# Patient Record
Sex: Female | Born: 2000 | Race: White | Hispanic: No | Marital: Married | Smoking: Former smoker
Health system: Southern US, Community
[De-identification: ages and names within clinical notes are randomized; demographics above are authoritative.]

## PROBLEM LIST (undated history)

## (undated) ENCOUNTER — Inpatient Hospital Stay: Payer: Self-pay

## (undated) DIAGNOSIS — F419 Anxiety disorder, unspecified: Secondary | ICD-10-CM

## (undated) DIAGNOSIS — R51 Headache: Secondary | ICD-10-CM

## (undated) DIAGNOSIS — G43909 Migraine, unspecified, not intractable, without status migrainosus: Secondary | ICD-10-CM

## (undated) HISTORY — DX: Headache: R51

---
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
## 2021-01-28 ENCOUNTER — Telehealth: Payer: Self-pay | Admitting: Obstetrics and Gynecology

## 2021-01-28 NOTE — Telephone Encounter (Signed)
.. °  Medicaid Managed Care   Unsuccessful Outreach Note  01/28/2021 Name: Alyssa Rojas MRN: [id number] DOB: Jun 17, 2000  Referred by: Suzy Bouchard, MD Reason for referral : High Risk Managed Medicaid (I called the patient today to get her scheduled with the MM Team. I left my name and number on her VM.)   An unsuccessful telephone outreach was attempted today. The patient was referred to the case management team for assistance with care management and care coordination.   Follow Up Plan: The care management team will reach out to the patient again over the next 14 days.   Weston Settle Care Guide, High Risk Medicaid Managed Care Embedded Care Coordination [facility]   [facility]

## 2021-02-04 ENCOUNTER — Other Ambulatory Visit: Payer: Self-pay | Admitting: Obstetrics and Gynecology

## 2021-02-04 NOTE — Patient Instructions (Signed)
Hi Ms. Gorelik, sorry we missed you today- as a part of your Medicaid benefit, you are eligible for care management and care coordination services at no cost or copay. I was unable to reach you by phone today but would be happy to help you with your health related needs. Please feel free to call me at [phone].  A member of the Managed Medicaid care management team will reach out to you again over the next 7 days.   Aida Raider RN, BSN [place]   [facility] - Managed Medicaid High Risk [phone]

## 2021-02-04 NOTE — Patient Outreach (Signed)
Care Coordination  02/04/2021  [name] 23-Mar-2000 [id number]   Medicaid Managed Care   Unsuccessful Outreach Note  02/04/2021 Name: Alyssa Rojas MRN: [id number] DOB: Apr 16, 2000  Referred by: Suzy Bouchard, MD Reason for referral : High Risk Managed Medicaid (Unsuccessful telephone outreach)   An unsuccessful telephone outreach was attempted today. The patient was referred to the case management team for assistance with care management and care coordination.   Follow Up Plan: The care management team will reach out to the patient again over the next 7-14 days.   Kathi Der RN, BSN [place]   [facility], [employer] - Managed Medicaid High Risk [phone]

## 2021-02-11 ENCOUNTER — Telehealth: Payer: Self-pay | Admitting: Obstetrics and Gynecology

## 2021-02-11 NOTE — Telephone Encounter (Signed)
.. °  Medicaid Managed Care   Unsuccessful Outreach Note  02/11/2021 Name: Alyssa Rojas MRN: [id number] DOB: 2000/07/25  Referred by: Boykin Nearing, MD Reason for referral : High Risk Managed Medicaid (I called the patient today to get her rescheduled for a phone visit with the MM RNCM. Her VM was full.)   An unsuccessful telephone outreach was attempted today. The patient was referred to the case management team for assistance with care management and care coordination.   Follow Up Plan: The care management team will reach out to the patient again over the next 7 days.    [place]

## 2021-02-15 ENCOUNTER — Telehealth: Payer: Self-pay | Admitting: Obstetrics and Gynecology

## 2021-02-15 DIAGNOSIS — F418 Other specified anxiety disorders: Secondary | ICD-10-CM | POA: Diagnosis not present

## 2021-02-15 DIAGNOSIS — O99345 Other mental disorders complicating the puerperium: Secondary | ICD-10-CM | POA: Diagnosis not present

## 2021-02-15 DIAGNOSIS — Z1332 Encounter for screening for maternal depression: Secondary | ICD-10-CM | POA: Diagnosis not present

## 2021-02-15 DIAGNOSIS — Z30011 Encounter for initial prescription of contraceptive pills: Secondary | ICD-10-CM | POA: Diagnosis not present

## 2021-02-15 NOTE — Telephone Encounter (Signed)
.. °  Medicaid Managed Care   Unsuccessful Outreach Note  02/15/2021 Name: Alyssa Rojas MRN: [id number] DOB: [birth year]  Referred by: Suzy Bouchard, MD Reason for referral : No chief complaint on file.   Third unsuccessful telephone outreach was attempted today. The patient was referred to the case management team for assistance with care management and care coordination. The patient's primary care provider has been notified of our unsuccessful attempts to make or maintain contact with the patient. The care management team is pleased to engage with this patient at any time in the future should he/she be interested in assistance from the care management team.   Follow Up Plan: We have been unable to make contact with the patient for follow up. The care management team is available to follow up with the patient after provider conversation with the patient regarding recommendation for care management engagement and subsequent re-referral to the care management team.   Weston Settle Care Guide, High Risk Medicaid Managed Care Embedded Care Coordination [facility]   [facility]

## 2021-03-01 DIAGNOSIS — Z1332 Encounter for screening for maternal depression: Secondary | ICD-10-CM | POA: Diagnosis not present

## 2021-03-01 DIAGNOSIS — O99345 Other mental disorders complicating the puerperium: Secondary | ICD-10-CM | POA: Diagnosis not present

## 2021-09-12 ENCOUNTER — Other Ambulatory Visit: Payer: Self-pay

## 2021-09-12 ENCOUNTER — Encounter: Payer: Self-pay | Admitting: Emergency Medicine

## 2021-09-12 ENCOUNTER — Emergency Department
Admission: EM | Admit: 2021-09-12 | Discharge: 2021-09-12 | Disposition: A | Discharge status: Home / Self Care | Payer: Medicaid Other | Attending: Emergency Medicine | Admitting: Emergency Medicine

## 2021-09-12 ENCOUNTER — Emergency Department: Payer: Medicaid Other

## 2021-09-12 DIAGNOSIS — Z20822 Contact with and (suspected) exposure to covid-19: Secondary | ICD-10-CM | POA: Diagnosis not present

## 2021-09-12 DIAGNOSIS — R110 Nausea: Secondary | ICD-10-CM

## 2021-09-12 DIAGNOSIS — R197 Diarrhea, unspecified: Secondary | ICD-10-CM | POA: Insufficient documentation

## 2021-09-12 DIAGNOSIS — R1110 Vomiting, unspecified: Secondary | ICD-10-CM

## 2021-09-12 DIAGNOSIS — R1011 Right upper quadrant pain: Secondary | ICD-10-CM | POA: Diagnosis not present

## 2021-09-12 DIAGNOSIS — N390 Urinary tract infection, site not specified: Secondary | ICD-10-CM

## 2021-09-12 DIAGNOSIS — R945 Abnormal results of liver function studies: Secondary | ICD-10-CM | POA: Insufficient documentation

## 2021-09-12 DIAGNOSIS — R112 Nausea with vomiting, unspecified: Secondary | ICD-10-CM | POA: Insufficient documentation

## 2021-09-12 LAB — COMPREHENSIVE METABOLIC PANEL
ALT: 77 U/L — ABNORMAL HIGH (ref 0–44)
AST: 44 U/L — ABNORMAL HIGH (ref 15–41)
Albumin: 4.7 g/dL (ref 3.5–5.0)
Alkaline Phosphatase: 46 U/L (ref 38–126)
Anion gap: 9 (ref 5–15)
BUN: 10 mg/dL (ref 6–20)
CO2: 22 mmol/L (ref 22–32)
Calcium: 9.8 mg/dL (ref 8.9–10.3)
Chloride: 110 mmol/L (ref 98–111)
Creatinine, Ser: 0.91 mg/dL (ref 0.44–1.00)
GFR, Estimated: >60 mL/min (ref >60)
Glucose, Bld: 165 mg/dL — ABNORMAL HIGH (ref 70–99)
Potassium: 3.4 mmol/L — ABNORMAL LOW (ref 3.5–5.1)
Sodium: 141 mmol/L (ref 135–145)
Total Bilirubin: 0.7 mg/dL (ref 0.3–1.2)
Total Protein: 7.4 g/dL (ref 6.5–8.1)

## 2021-09-12 LAB — CK: Total CK: 75 U/L (ref 38–234)

## 2021-09-12 LAB — URINALYSIS, ROUTINE W REFLEX MICROSCOPIC
Bilirubin Urine: NEGATIVE
Glucose, UA: NEGATIVE mg/dL
Ketones, ur: 80 mg/dL — AB
Leukocytes,Ua: NEGATIVE
Nitrite: NEGATIVE
Protein, ur: 30 mg/dL — AB
Specific Gravity, Urine: 1.024 (ref 1.005–1.030)
pH: 5 (ref 5.0–8.0)

## 2021-09-12 LAB — CBC
HCT: 38.8 % (ref 36.0–46.0)
Hemoglobin: 13 g/dL (ref 12.0–15.0)
MCH: 28.6 pg (ref 26.0–34.0)
MCHC: 33.5 g/dL (ref 30.0–36.0)
MCV: 85.5 fL (ref 80.0–100.0)
Platelets: 298 10*3/uL (ref 150–400)
RBC: 4.54 MIL/uL (ref 3.87–5.11)
RDW: 12.4 % (ref 11.5–15.5)
WBC: 10.3 10*3/uL (ref 4.0–10.5)
nRBC: 0 % (ref 0.0–0.2)

## 2021-09-12 LAB — RESP PANEL BY RT-PCR (FLU A&B, COVID) ARPGX2
Influenza A by PCR: NEGATIVE
Influenza B by PCR: NEGATIVE
SARS Coronavirus 2 by RT PCR: NEGATIVE

## 2021-09-12 LAB — HCG, QUANTITATIVE, PREGNANCY: hCG, Beta Chain, Quant, S: 1 m[IU]/mL (ref <5)

## 2021-09-12 LAB — LIPASE, BLOOD: Lipase: 24 U/L (ref 11–51)

## 2021-09-12 LAB — POC URINE PREG, ED: Preg Test, Ur: NEGATIVE

## 2021-09-12 MED ORDER — ONDANSETRON HCL 4 MG/2ML IJ SOLN
INTRAMUSCULAR | Status: AC
  Administered 2021-09-12: 4 mg
  Filled 2021-09-12: qty 2

## 2021-09-12 MED ORDER — DROPERIDOL 2.5 MG/ML IJ SOLN: 2.5000 mg | Freq: Once | INTRAMUSCULAR | Status: DC

## 2021-09-12 MED ORDER — LACTATED RINGERS IV BOLUS: 1000.0000 mL | Freq: Once | INTRAVENOUS | Status: DC

## 2021-09-12 MED ORDER — CEPHALEXIN 500 MG PO CAPS: 500.0000 mg | ORAL_CAPSULE | Freq: Three times a day (TID) | ORAL | 0 refills | Status: AC

## 2021-09-12 MED ORDER — SODIUM CHLORIDE 0.9 % IV BOLUS
1000.0000 mL | Freq: Once | INTRAVENOUS | Status: AC
Start: 2021-09-12 — End: 2021-09-12
  Administered 2021-09-12: 1000 mL via INTRAVENOUS

## 2021-09-12 MED ORDER — ONDANSETRON HCL 4 MG/2ML IJ SOLN: 4.0000 mg | Freq: Once | INTRAMUSCULAR | Status: DC

## 2021-09-12 MED ORDER — DROPERIDOL 2.5 MG/ML IJ SOLN: 1.2500 mg | Freq: Once | INTRAMUSCULAR | Status: DC | PRN

## 2021-09-12 MED ORDER — ONDANSETRON 4 MG PO TBDP: 4.0000 mg | ORAL_TABLET | Freq: Three times a day (TID) | ORAL | 0 refills | Status: DC | PRN

## 2021-09-12 MED ORDER — METOCLOPRAMIDE HCL 5 MG/ML IJ SOLN
10.0000 mg | Freq: Once | INTRAMUSCULAR | Status: AC
  Administered 2021-09-12: 10 mg via INTRAVENOUS
  Filled 2021-09-12: qty 2

## 2021-09-12 MED ORDER — ONDANSETRON HCL 4 MG/2ML IJ SOLN
4.0000 mg | Freq: Once | INTRAMUSCULAR | Status: AC
  Administered 2021-09-12: 4 mg via INTRAVENOUS
  Filled 2021-09-12: qty 2

## 2021-09-12 NOTE — Discharge Instructions (Addendum)
I will give you some Zofran melt on your tongue wafers to use for nausea.  1 way for 3 times a day.  I will give you some Keflex antibiotic 1 pill 3 times a day also to treat the urinary tract infection that showed up in the urine.  Please do not hesitate to return if you start feeling worse, run a fever or cannot keep anything down again.

## 2021-09-12 NOTE — ED Provider Notes (Signed)
[facility] Provider Note    Event Date/Time   First MD Initiated Contact with Patient 09/12/21 [phone]     (approximate)   History   Nausea and Vomiting   HPI  Alyssa Rojas is a 21 y.o. female 9 months postpartum who comes in with concerns for nausea and vomiting.  Patient reports 3 days and not been able to eat with upper abdominal discomfort nausea, vomiting.  She does report having 1 episode of diarrhea this morning.  She denies any headaches falls hitting her head, chest pain or shortness of breath.  She denies any lower abdominal tenderness.  She denies any prior known history of issues with her gallbladder.  She did have a lot of hyperemesis gravidarum in the setting of being pregnant but denies currently being pregnant.  She did report feeling warm today as well but then never took a temperature.  They report patient having a history of CBD use but they deny any marijuana use.  Physical Exam   Triage Vital Signs: ED Triage Vitals  Enc Vitals Group     BP 09/12/21 0608 (!) 145/90     Pulse Rate 09/12/21 0608 84     Resp 09/12/21 0608 (!) 22     Temp 09/12/21 0608 97.8 F (36.6 C)     Temp Source 09/12/21 0608 Oral     SpO2 09/12/21 0608 99 %     Weight 09/12/21 0604 103 lb (46.7 kg)     Height 09/12/21 0604 5\' 1"  (1.549 m)     Head Circumference --      Peak Flow --      Pain Score 09/12/21 0604 0     Pain Loc --      Pain Edu? --      Excl. in GC? --     Most recent vital signs: Vitals:   09/12/21 0608  BP: (!) 145/90  Pulse: 84  Resp: (!) 22  Temp: 97.8 F (36.6 C)  SpO2: 99%     General: Awake, no distress.  CV:  Good peripheral perfusion.  Resp:  Normal effort.  Abd:  No distention.  Slight tenderness in the upper abdomen. Other:  Patient is actively vomiting yellowish   ED Results / Procedures / Treatments   Labs (all labs ordered are listed, but only abnormal results are displayed) Labs Reviewed  COMPREHENSIVE  METABOLIC PANEL - Abnormal; Notable for the following components:      Result Value   Potassium 3.4 (*)    Glucose, Bld 165 (*)    AST 44 (*)    ALT 77 (*)    All other components within normal limits  RESP PANEL BY RT-PCR (FLU A&B, COVID) ARPGX2  LIPASE, BLOOD  CBC  URINALYSIS, ROUTINE W REFLEX MICROSCOPIC  HCG, QUANTITATIVE, PREGNANCY  CK  POC URINE PREG, ED      RADIOLOGY Pending   PROCEDURES:  Critical Care performed: No  Procedures   MEDICATIONS ORDERED IN ED: Medications  ondansetron (ZOFRAN) injection 4 mg (has no administration in time range)  droperidol (INAPSINE) 2.5 MG/ML injection 2.5 mg (has no administration in time range)  sodium chloride 0.9 % bolus 1,000 mL (has no administration in time range)  ondansetron (ZOFRAN) 4 MG/2ML injection (4 mg  Given 09/12/21 09/14/21)     IMPRESSION / MDM / ASSESSMENT AND PLAN / ED COURSE  I reviewed the triage vital signs and the nursing notes.   Patient's presentation is most consistent  with acute presentation with potential threat to life or bodily function.   Differential includes COVID, flu, gallbladder issues other viral illness.  Patient was given Zofran but still actively vomiting therefore I will give a dose of droperidol and some fluids.  CBC shows normal white count hemoglobin stable.  Lipase is normal.  Her LFTs are slightly elevated but she has had that previously could just be from all the vomiting but given a little bit of upper abdominal discomfort I ordered an ultrasound to rule out gallstones.      FINAL CLINICAL IMPRESSION(S) / ED DIAGNOSES   Final diagnoses:  Vomiting, unspecified vomiting type, unspecified whether nausea present     Rx / DC Orders   ED Discharge Orders     None        Note:  This document was prepared using Dragon voice recognition software and may include unintentional dictation errors.   Concha Se, MD 09/12/21 [phone]

## 2021-09-12 NOTE — ED Notes (Signed)
Patient given ginger ale for PO trial

## 2021-09-12 NOTE — ED Notes (Signed)
Pt ambulatory to waiting room. Pt verbalized understanding of discharge instructions.   

## 2021-09-12 NOTE — ED Triage Notes (Signed)
Pt to ED from home c/o n/v x3 days intermittently, diarrhea this morning, decreased appetite as well.  Husband states something similar when she was pregnant, but states had her period 2 days ago.  Denies fevers or urinary changes, denies pain.  Pt uncontrollably retching.

## 2021-09-12 NOTE — ED Notes (Signed)
Patient aware of needing a urine sample at this time. States she cannot provide at this time. IV fluids started.

## 2021-09-27 ENCOUNTER — Encounter: Payer: Self-pay | Admitting: Intensive Care

## 2021-09-27 ENCOUNTER — Emergency Department
Admission: EM | Admit: 2021-09-27 | Discharge: 2021-09-27 | Disposition: A | Discharge status: Home / Self Care | Payer: Medicaid Other | Attending: Emergency Medicine | Admitting: Emergency Medicine

## 2021-09-27 ENCOUNTER — Other Ambulatory Visit: Payer: Self-pay

## 2021-09-27 DIAGNOSIS — R1115 Cyclical vomiting syndrome unrelated to migraine: Secondary | ICD-10-CM | POA: Insufficient documentation

## 2021-09-27 DIAGNOSIS — R112 Nausea with vomiting, unspecified: Secondary | ICD-10-CM | POA: Diagnosis present

## 2021-09-27 HISTORY — DX: Anxiety disorder, unspecified: F41.9

## 2021-09-27 LAB — CBC
HCT: 41.5 % (ref 36.0–46.0)
Hemoglobin: 13.7 g/dL (ref 12.0–15.0)
MCH: 28.2 pg (ref 26.0–34.0)
MCHC: 33 g/dL (ref 30.0–36.0)
MCV: 85.6 fL (ref 80.0–100.0)
Platelets: 237 10*3/uL (ref 150–400)
RBC: 4.85 MIL/uL (ref 3.87–5.11)
RDW: 12.6 % (ref 11.5–15.5)
WBC: 11.1 10*3/uL — ABNORMAL HIGH (ref 4.0–10.5)
nRBC: 0 % (ref 0.0–0.2)

## 2021-09-27 LAB — URINALYSIS, ROUTINE W REFLEX MICROSCOPIC
Bacteria, UA: NONE SEEN
Bilirubin Urine: NEGATIVE
Glucose, UA: NEGATIVE mg/dL
Hgb urine dipstick: NEGATIVE
Ketones, ur: 20 mg/dL — AB
Leukocytes,Ua: NEGATIVE
Nitrite: NEGATIVE
Protein, ur: 30 mg/dL — AB
Specific Gravity, Urine: 1.025 (ref 1.005–1.030)
pH: 5 (ref 5.0–8.0)

## 2021-09-27 LAB — COMPREHENSIVE METABOLIC PANEL
ALT: 14 U/L (ref 0–44)
AST: 18 U/L (ref 15–41)
Albumin: 5.1 g/dL — ABNORMAL HIGH (ref 3.5–5.0)
Alkaline Phosphatase: 40 U/L (ref 38–126)
Anion gap: 11 (ref 5–15)
BUN: 10 mg/dL (ref 6–20)
CO2: 19 mmol/L — ABNORMAL LOW (ref 22–32)
Calcium: 9.7 mg/dL (ref 8.9–10.3)
Chloride: 108 mmol/L (ref 98–111)
Creatinine, Ser: 0.8 mg/dL (ref 0.44–1.00)
GFR, Estimated: >60 mL/min (ref >60)
Glucose, Bld: 117 mg/dL — ABNORMAL HIGH (ref 70–99)
Potassium: 3.7 mmol/L (ref 3.5–5.1)
Sodium: 138 mmol/L (ref 135–145)
Total Bilirubin: 1.2 mg/dL (ref 0.3–1.2)
Total Protein: 7.8 g/dL (ref 6.5–8.1)

## 2021-09-27 LAB — TSH: TSH: 0.435 u[IU]/mL (ref 0.350–4.500)

## 2021-09-27 LAB — LIPASE, BLOOD: Lipase: 24 U/L (ref 11–51)

## 2021-09-27 LAB — POC URINE PREG, ED: Preg Test, Ur: NEGATIVE

## 2021-09-27 MED ORDER — PROMETHAZINE HCL 12.5 MG PO TABS
12.5000 mg | ORAL_TABLET | Freq: Four times a day (QID) | ORAL | 0 refills | Status: DC | PRN
Start: 2021-09-27 — End: 2023-08-22

## 2021-09-27 MED ORDER — OMEPRAZOLE MAGNESIUM 20 MG PO TBEC: 20.0000 mg | DELAYED_RELEASE_TABLET | Freq: Every day | ORAL | 1 refills | Status: DC

## 2021-09-27 NOTE — ED Provider Notes (Signed)
[facility] Provider Note    Event Date/Time   First MD Initiated Contact with Patient 09/27/21 1608     (approximate)   History   Chief Complaint Emesis and Anxiety   HPI  Alyssa Rojas is a 21 y.o. female with past medical history of migraines and anxiety who presents to the ED complaining of nausea and vomiting.  Patient reports that she has been dealing with daily episodes of nausea and vomiting for at least the past 2 weeks.  She states that she typically feels nauseous when she gets up in the morning, has been able to drink liquids but whenever she tries to eat eat solids, she feels very full and ends up vomiting.  She can eat a few small bites at a time without vomiting, but more significant intake always results and epigastric pain with vomiting.  She reports weight loss of about 6 pounds over the past month, has been taking Zofran with partial relief.  She reports significant issues with nausea and vomiting during her pregnancy about 1 year ago, also had significant issues with her stomach when she was a child, previously followed at [facility] pediatric gastroenterology.  She does not currently take any medication for her stomach other than Zofran.  She was treated for a UTI a few weeks ago but currently denies any urinary symptoms.  She is additionally concerned about a small lump to the left side of her neck that is nontender.     Physical Exam   Triage Vital Signs: ED Triage Vitals  Enc Vitals Group     BP 09/27/21 1509 (!) 127/92     Pulse Rate 09/27/21 1509 78     Resp 09/27/21 1509 18     Temp 09/27/21 1509 97.8 F (36.6 C)     Temp Source 09/27/21 1509 Oral     SpO2 09/27/21 1509 93 %     Weight 09/27/21 1500 93 lb 1.6 oz (42.2 kg)     Height 09/27/21 1500 5\' 1"  (1.549 m)     Head Circumference --      Peak Flow --      Pain Score 09/27/21 1505 0     Pain Loc --      Pain Edu? --      Excl. in GC? --     Most recent vital  signs: Vitals:   09/27/21 1509  BP: (!) 127/92  Pulse: 78  Resp: 18  Temp: 97.8 F (36.6 C)  SpO2: 93%    Constitutional: Alert and oriented. Eyes: Conjunctivae are normal. Head: Atraumatic. Nose: No congestion/rhinnorhea. Mouth/Throat: Mucous membranes are moist.  Cardiovascular: Normal rate, regular rhythm. Grossly normal heart sounds.  2+ radial pulses bilaterally. Respiratory: Normal respiratory effort.  No retractions. Lungs CTAB. Gastrointestinal: Soft and nontender. No distention. Musculoskeletal: No lower extremity tenderness nor edema.  Neurologic:  Normal speech and language. No gross focal neurologic deficits are appreciated.    ED Results / Procedures / Treatments   Labs (all labs ordered are listed, but only abnormal results are displayed) Labs Reviewed  COMPREHENSIVE METABOLIC PANEL - Abnormal; Notable for the following components:      Result Value   CO2 19 (*)    Glucose, Bld 117 (*)    Albumin 5.1 (*)    All other components within normal limits  CBC - Abnormal; Notable for the following components:   WBC 11.1 (*)    All other components within normal limits  URINALYSIS,  ROUTINE W REFLEX MICROSCOPIC - Abnormal; Notable for the following components:   Color, Urine YELLOW (*)    APPearance HAZY (*)    Ketones, ur 20 (*)    Protein, ur 30 (*)    All other components within normal limits  LIPASE, BLOOD  TSH  POC URINE PREG, ED    PROCEDURES:  Critical Care performed: No  Procedures   MEDICATIONS ORDERED IN ED: Medications - No data to display   IMPRESSION / MDM / ASSESSMENT AND PLAN / ED COURSE  I reviewed the triage vital signs and the nursing notes.                              21 y.o. female with past medical history of migraines and anxiety who presents to the ED with recurrent episodes of nausea and vomiting most days along with significant difficulty eating solids.  Patient's presentation is most consistent with acute presentation  with potential threat to life or bodily function.  Differential diagnosis includes, but is not limited to, biliary colic, cholecystitis, dehydration, electrolyte abnormality, AKI, anemia, pregnancy, UTI, cyclic vomiting, gastritis.  Patient nontoxic-appearing and in no acute distress, vital signs are unremarkable.  Labs are reassuring with no significant anemia, leukocytosis, electrolyte abnormality, or AKI.  LFTs and lipase are also unremarkable.  Patient has a benign abdominal exam, recently underwent right upper quadrant ultrasound that was unremarkable, do not feel CT imaging is indicated today.  Pregnancy testing is negative and urinalysis shows no signs of infection.  Patient would benefit from outpatient follow-up, will trial PPI for now and prescribe Phenergan for use as needed as an alternative to Zofran.  She was counseled to return to the ED for new or worsening symptoms, patient agrees with plan.      FINAL CLINICAL IMPRESSION(S) / ED DIAGNOSES   Final diagnoses:  Cyclic vomiting syndrome     Rx / DC Orders   ED Discharge Orders          Ordered    omeprazole (PRILOSEC OTC) 20 MG tablet  Daily        09/27/21 1705    promethazine (PHENERGAN) 12.5 MG tablet  Every 6 hours PRN        09/27/21 1706             Note:  This document was prepared using Dragon voice recognition software and may include unintentional dictation errors.   Chesley Noon, MD 09/27/21 [phone]

## 2021-09-27 NOTE — ED Triage Notes (Signed)
Patient c/o nausea/emesis intermittent X2-3 weeks. Reports abdominal pain every morning. Appears to have some anxiety. Was told during her pregnancy last year her thyroid levels were high.  Patient also c/o lump on right side of neck

## 2021-10-17 DIAGNOSIS — R1013 Epigastric pain: Secondary | ICD-10-CM | POA: Diagnosis not present

## 2021-10-17 DIAGNOSIS — R198 Other specified symptoms and signs involving the digestive system and abdomen: Secondary | ICD-10-CM | POA: Diagnosis not present

## 2021-10-17 DIAGNOSIS — Z23 Encounter for immunization: Secondary | ICD-10-CM | POA: Diagnosis not present

## 2021-11-29 DIAGNOSIS — R1013 Epigastric pain: Secondary | ICD-10-CM | POA: Diagnosis not present

## 2021-12-03 DIAGNOSIS — R198 Other specified symptoms and signs involving the digestive system and abdomen: Secondary | ICD-10-CM | POA: Diagnosis not present

## 2021-12-03 DIAGNOSIS — R894 Abnormal immunological findings in specimens from other organs, systems and tissues: Secondary | ICD-10-CM | POA: Diagnosis not present

## 2021-12-03 DIAGNOSIS — R140 Abdominal distension (gaseous): Secondary | ICD-10-CM | POA: Diagnosis not present

## 2021-12-03 DIAGNOSIS — R1013 Epigastric pain: Secondary | ICD-10-CM | POA: Diagnosis not present

## 2022-01-21 ENCOUNTER — Ambulatory Visit
Admission: RE | Admit: 2022-01-21 | Discharge: 2022-01-21 | Disposition: A | Discharge status: Home / Self Care | Payer: Medicaid Other | Source: Ambulatory Visit | Attending: Gastroenterology | Admitting: Gastroenterology

## 2022-01-21 ENCOUNTER — Encounter
Admission: RE | Disposition: A | Discharge status: Home / Self Care | Payer: Self-pay | Source: Ambulatory Visit | Attending: Gastroenterology

## 2022-01-21 ENCOUNTER — Ambulatory Visit: Payer: Medicaid Other | Admitting: General Practice

## 2022-01-21 ENCOUNTER — Encounter: Payer: Self-pay | Admitting: *Deleted

## 2022-01-21 ENCOUNTER — Other Ambulatory Visit: Payer: Self-pay

## 2022-01-21 DIAGNOSIS — R768 Other specified abnormal immunological findings in serum: Secondary | ICD-10-CM | POA: Diagnosis not present

## 2022-01-21 DIAGNOSIS — F419 Anxiety disorder, unspecified: Secondary | ICD-10-CM | POA: Diagnosis not present

## 2022-01-21 DIAGNOSIS — K2970 Gastritis, unspecified, without bleeding: Secondary | ICD-10-CM | POA: Diagnosis not present

## 2022-01-21 DIAGNOSIS — R1013 Epigastric pain: Secondary | ICD-10-CM | POA: Diagnosis not present

## 2022-01-21 DIAGNOSIS — F17200 Nicotine dependence, unspecified, uncomplicated: Secondary | ICD-10-CM | POA: Insufficient documentation

## 2022-01-21 DIAGNOSIS — K2960 Other gastritis without bleeding: Secondary | ICD-10-CM | POA: Diagnosis not present

## 2022-01-21 DIAGNOSIS — R112 Nausea with vomiting, unspecified: Secondary | ICD-10-CM | POA: Insufficient documentation

## 2022-01-21 DIAGNOSIS — K219 Gastro-esophageal reflux disease without esophagitis: Secondary | ICD-10-CM | POA: Insufficient documentation

## 2022-01-21 DIAGNOSIS — K3189 Other diseases of stomach and duodenum: Secondary | ICD-10-CM | POA: Diagnosis not present

## 2022-01-21 DIAGNOSIS — R894 Abnormal immunological findings in specimens from other organs, systems and tissues: Secondary | ICD-10-CM | POA: Diagnosis not present

## 2022-01-21 HISTORY — PX: ESOPHAGOGASTRODUODENOSCOPY (EGD) WITH PROPOFOL: SHX5813

## 2022-01-21 SURGERY — ESOPHAGOGASTRODUODENOSCOPY (EGD) WITH PROPOFOL
Anesthesia: General

## 2022-01-21 MED ORDER — MIDAZOLAM HCL 2 MG/2ML IJ SOLN
INTRAMUSCULAR | Status: AC
  Filled 2022-01-21: qty 2

## 2022-01-21 MED ORDER — PROPOFOL 10 MG/ML IV BOLUS
INTRAVENOUS | Status: AC
  Filled 2022-01-21: qty 20

## 2022-01-21 MED ORDER — LIDOCAINE HCL (CARDIAC) PF 100 MG/5ML IV SOSY
PREFILLED_SYRINGE | INTRAVENOUS | Status: DC | PRN
  Administered 2022-01-21: 30 mg via INTRAVENOUS

## 2022-01-21 MED ORDER — SODIUM CHLORIDE 0.9 % IV SOLN
INTRAVENOUS | Status: DC
Start: 2022-01-21 — End: 2022-01-21

## 2022-01-21 MED ORDER — PROPOFOL 500 MG/50ML IV EMUL
INTRAVENOUS | Status: DC | PRN
  Administered 2022-01-21: 150 ug/kg/min via INTRAVENOUS

## 2022-01-21 MED ORDER — DEXMEDETOMIDINE HCL IN NACL 80 MCG/20ML IV SOLN
INTRAVENOUS | Status: DC | PRN
  Administered 2022-01-21: 6 ug via BUCCAL

## 2022-01-21 MED ORDER — MIDAZOLAM HCL 2 MG/2ML IJ SOLN
INTRAMUSCULAR | Status: DC | PRN
  Administered 2022-01-21: 1 mg via INTRAVENOUS

## 2022-01-21 MED ORDER — PROPOFOL 10 MG/ML IV BOLUS
INTRAVENOUS | Status: DC | PRN
  Administered 2022-01-21: 60 mg via INTRAVENOUS

## 2022-01-21 NOTE — Op Note (Signed)
[facility] Gastroenterology Patient Name: Alyssa Rojas Procedure Date: 01/21/2022 9:57 AM MRN: [id number] Account #: [id number] Date of Birth: [birth year] Admit Type: Outpatient Age: 22 Room: [facility] ENDO ROOM 1 Gender: Female Note Status: Finalized Instrument Name: Upper Endoscope [id number] Procedure:             Upper GI endoscopy Indications:           Dyspepsia, Positive celiac serologies Providers:             Andrey Farmer MD, MD Referring MD:          No Local Md, MD (Referring MD) Medicines:             Monitored Anesthesia Care Complications:         No immediate complications. Estimated blood loss:                         Minimal. Procedure:             Pre-Anesthesia Assessment:                        - Prior to the procedure, a History and Physical was                         performed, and patient medications and allergies were                         reviewed. The patient is competent. The risks and                         benefits of the procedure and the sedation options and                         risks were discussed with the patient. All questions                         were answered and informed consent was obtained.                         Patient identification and proposed procedure were                         verified by the physician, the nurse, the                         anesthesiologist, the anesthetist and the technician                         in the endoscopy suite. Mental Status Examination:                         alert and oriented. Airway Examination: normal                         oropharyngeal airway and neck mobility. Respiratory                         Examination: clear to auscultation. CV Examination:  normal. Prophylactic Antibiotics: The patient does not                         require prophylactic antibiotics. Prior                         Anticoagulants: The patient has taken no anticoagulant                          or antiplatelet agents. ASA Grade Assessment: II - A                         patient with mild systemic disease. After reviewing                         the risks and benefits, the patient was deemed in                         satisfactory condition to undergo the procedure. The                         anesthesia plan was to use monitored anesthesia care                         (MAC). Immediately prior to administration of                         medications, the patient was re-assessed for adequacy                         to receive sedatives. The heart rate, respiratory                         rate, oxygen saturations, blood pressure, adequacy of                         pulmonary ventilation, and response to care were                         monitored throughout the procedure. The physical                         status of the patient was re-assessed after the                         procedure.                        After obtaining informed consent, the endoscope was                         passed under direct vision. Throughout the procedure,                         the patient's blood pressure, pulse, and oxygen                         saturations were monitored continuously. The  Endosonoscope was introduced through the mouth, and                         advanced to the second part of duodenum. The upper GI                         endoscopy was accomplished without difficulty. The                         patient tolerated the procedure well. Findings:      The examined esophagus was normal.      The entire examined stomach was normal. Biopsies were taken with a cold       forceps for Helicobacter pylori testing. Estimated blood loss was       minimal.      The examined duodenum was normal. Biopsies for histology were taken with       a cold forceps for evaluation of celiac disease. Estimated blood loss       was minimal. Impression:             - Normal esophagus.                        - Normal stomach. Biopsied.                        - Normal examined duodenum. Biopsied. Recommendation:        - Discharge patient to home.                        - Resume previous diet.                        - Continue present medications.                        - Await pathology results.                        - Return to referring physician as previously                         scheduled. Procedure Code(s):     --- Professional ---                        [phone], Esophagogastroduodenoscopy, flexible,                         transoral; with biopsy, single or multiple Diagnosis Code(s):     --- Professional ---                        R10.13, Epigastric pain                        R76.8, Other specified abnormal immunological findings                         in serum CPT copyright 2022 American Medical Association. All rights reserved. The codes documented in this report are preliminary and upon coder review may  be revised to meet current compliance requirements. Andrey Farmer MD, MD  01/21/2022 10:45:19 AM Number of Addenda: 0 Note Initiated On: 01/21/2022 9:57 AM Estimated Blood Loss:  Estimated blood loss was minimal.      [facility]

## 2022-01-21 NOTE — Anesthesia Preprocedure Evaluation (Addendum)
Anesthesia Evaluation  Patient identified by MRN, date of birth, ID band Patient awake    Reviewed: Allergy & Precautions, NPO status , Patient's Chart, lab work & pertinent test results  Airway Mallampati: I  TM Distance: >3 FB Neck ROM: full    Dental  (+) Chipped   Pulmonary Current Smoker and Patient abstained from smoking.   Pulmonary exam normal        Cardiovascular negative cardio ROS Normal cardiovascular exam     Neuro/Psych  PSYCHIATRIC DISORDERS      negative neurological ROS     GI/Hepatic Neg liver ROS,GERD  ,,  Endo/Other  negative endocrine ROS    Renal/GU negative Renal ROS  negative genitourinary   Musculoskeletal   Abdominal   Peds  Hematology negative hematology ROS (+)   Anesthesia Other Findings Past Medical History: No date: Anxiety No date: Headache(784.0) No date: Migraines  Past Surgical History: 2012: TONSILLECTOMY AND ADENOIDECTOMY     Comment:  Done at [facility]  BMI    Body Mass Index: 18.17 kg/m      Reproductive/Obstetrics negative OB ROS                              Anesthesia Physical Anesthesia Plan  ASA: 2  Anesthesia Plan: General   Post-op Pain Management: Minimal or no pain anticipated   Induction: Intravenous  PONV Risk Score and Plan: 3 and Propofol infusion, TIVA and Ondansetron  Airway Management Planned: Nasal Cannula  Additional Equipment: None  Intra-op Plan:   Post-operative Plan:   Informed Consent: I have reviewed the patients History and Physical, chart, labs and discussed the procedure including the risks, benefits and alternatives for the proposed anesthesia with the patient or authorized representative who has indicated his/her understanding and acceptance.     Dental advisory given  Plan Discussed with: CRNA and Surgeon  Anesthesia Plan Comments: (Discussed risks of anesthesia with patient, including  possibility of difficulty with spontaneous ventilation under anesthesia necessitating airway intervention, PONV, and rare risks such as cardiac or respiratory or neurological events, and allergic reactions. Discussed the role of CRNA in patient's perioperative care. Patient understands.)        Anesthesia Quick Evaluation

## 2022-01-21 NOTE — Anesthesia Postprocedure Evaluation (Signed)
Anesthesia Post Note  Patient: Alyssa Rojas  Procedure(s) Performed: ESOPHAGOGASTRODUODENOSCOPY (EGD) WITH PROPOFOL  Patient location during evaluation: Endoscopy Anesthesia Type: General Level of consciousness: awake and alert Pain management: pain level controlled Vital Signs Assessment: post-procedure vital signs reviewed and stable Respiratory status: spontaneous breathing, nonlabored ventilation, respiratory function stable and patient connected to nasal cannula oxygen Cardiovascular status: blood pressure returned to baseline and stable Postop Assessment: no apparent nausea or vomiting Anesthetic complications: no  No notable events documented.   Last Vitals:  Vitals:   01/21/22 1059 01/21/22 1109  BP: 111/76 107/78  Pulse: 75   Resp: (!) 22 14  Temp:  36.5 C  SpO2: 100% 100%    Last Pain:  Vitals:   01/21/22 1109  TempSrc: Temporal  PainSc:                  Dimas Millin

## 2022-01-21 NOTE — Transfer of Care (Signed)
Immediate Anesthesia Transfer of Care Note  Patient: Alyssa Rojas  Procedure(s) Performed: ESOPHAGOGASTRODUODENOSCOPY (EGD) WITH PROPOFOL  Patient Location: PACU and Endoscopy Unit  Anesthesia Type:MAC  Level of Consciousness: awake, sedated, and drowsy  Airway & Oxygen Therapy: Patient Spontanous Breathing  Post-op Assessment: Report given to RN, Post -op Vital signs reviewed and stable, and Patient moving all extremities  Post vital signs: Reviewed and stable  Last Vitals:  Vitals Value Taken Time  BP 109/68 01/21/22 1049  Temp    Pulse 78 01/21/22 1049  Resp 23 01/21/22 1049  SpO2 100 % 01/21/22 1049    Last Pain:  Vitals:   01/21/22 1003  TempSrc: Temporal  PainSc: 0-No pain         Complications: No notable events documented.

## 2022-01-21 NOTE — OR Nursing (Signed)
POC urine pregnancy test result negative.

## 2022-01-21 NOTE — H&P (Signed)
Outpatient short stay form Pre-procedure 01/21/2022  Lesly Rubenstein, MD  Primary Physician: Patient, No Pcp Per  Reason for visit:  Positive celiac test/Dyspepsia  History of present illness:    22 y/o lady with history of dyspepsia, nausea, and vomiting in the setting of marijuana use who is here for EGD. Vomiting has been better off of marijuana. No blood thinners. No family history of GI malignancies.    Current Facility-Administered Medications:    0.9 %  sodium chloride infusion, , Intravenous, Continuous, Carmel Waddington, Hilton Cork, MD, Last Rate: 20 mL/hr at 01/21/22 1015, New Bag at 01/21/22 1015  Medications Prior to Admission  Medication Sig Dispense Refill Last Dose   ferrous gluconate (FERGON) 240 (27 FE) MG tablet Take 1 tablet (240 mg total) by mouth in the morning and at bedtime.   01/20/2022   omeprazole (PRILOSEC OTC) 20 MG tablet Take 1 tablet (20 mg total) by mouth daily. 28 tablet 1 01/20/2022   acetaminophen (TYLENOL) 325 MG tablet Take 2 tablets (650 mg total) by mouth every 4 (four) hours as needed (for pain scale < 4).      benzocaine-Menthol (DERMOPLAST) 20-0.5 % AERO Apply 1 application topically as needed for irritation (perineal discomfort).      ibuprofen (ADVIL) 600 MG tablet Take 1 tablet (600 mg total) by mouth every 6 (six) hours. 30 tablet 0    ondansetron (ZOFRAN ODT) 4 MG disintegrating tablet Take 1 tablet (4 mg total) by mouth every 8 (eight) hours as needed. 15 tablet 0    ondansetron (ZOFRAN-ODT) 4 MG disintegrating tablet Take 1 tablet (4 mg total) by mouth every 8 (eight) hours as needed. 20 tablet 0    Prenatal Vit-Fe Fumarate-FA (PRENATAL VITAMINS) 28-0.8 MG TABS Take 1 tablet by mouth daily. (Patient not taking: Reported on 01/21/2022) 30 tablet 0 Not Taking   promethazine (PHENERGAN) 12.5 MG tablet Take 1 tablet (12.5 mg total) by mouth every 6 (six) hours as needed for nausea or vomiting. 15 tablet 0    senna-docusate (SENOKOT-S) 8.6-50 MG tablet Take 2  tablets by mouth daily.      witch hazel-glycerin (TUCKS) pad Apply 1 application topically as needed for hemorrhoids (for pain). 40 each 12      No Known Allergies   Past Medical History:  Diagnosis Date   Anxiety    Headache(784.0)    Migraines     Review of systems:  Otherwise negative.    Physical Exam  Gen: Alert, oriented. Appears stated age.  HEENT: PERRLA. Lungs: No respiratory distress CV: RRR Abd: soft, benign, no masses Ext: No edema    Planned procedures: Proceed with EGD. The patient understands the nature of the planned procedure, indications, risks, alternatives and potential complications including but not limited to bleeding, infection, perforation, damage to internal organs and possible oversedation/side effects from anesthesia. The patient agrees and gives consent to proceed.  Please refer to procedure notes for findings, recommendations and patient disposition/instructions.     Lesly Rubenstein, MD [facility] Gastroenterology

## 2022-01-21 NOTE — Interval H&P Note (Signed)
History and Physical Interval Note:  01/21/2022 10:23 AM  Alyssa Rojas  has presented today for surgery, with the diagnosis of DYSPEPSIA,ABNORMAL CELIAC ANTIBODY PANEL,.  The various methods of treatment have been discussed with the patient and family. After consideration of risks, benefits and other options for treatment, the patient has consented to  Procedure(s): ESOPHAGOGASTRODUODENOSCOPY (EGD) WITH PROPOFOL (N/A) as a surgical intervention.  The patient's history has been reviewed, patient examined, no change in status, stable for surgery.  I have reviewed the patient's chart and labs.  Questions were answered to the patient's satisfaction.     Lesly Rubenstein  Ok to proceed with EGD

## 2022-01-22 ENCOUNTER — Encounter: Payer: Self-pay | Admitting: Gastroenterology

## 2022-01-22 LAB — SURGICAL PATHOLOGY

## 2022-02-10 DIAGNOSIS — Z3202 Encounter for pregnancy test, result negative: Secondary | ICD-10-CM | POA: Diagnosis not present

## 2022-02-10 DIAGNOSIS — R1010 Upper abdominal pain, unspecified: Secondary | ICD-10-CM | POA: Diagnosis not present

## 2022-02-19 ENCOUNTER — Other Ambulatory Visit: Payer: Self-pay | Admitting: Gastroenterology

## 2022-02-19 DIAGNOSIS — R1084 Generalized abdominal pain: Secondary | ICD-10-CM

## 2022-03-07 ENCOUNTER — Encounter
Admission: RE | Admit: 2022-03-07 | Discharge: 2022-03-07 | Disposition: A | Discharge status: Home / Self Care | Payer: Medicaid Other | Source: Ambulatory Visit | Attending: Gastroenterology | Admitting: Gastroenterology

## 2022-03-07 DIAGNOSIS — R1084 Generalized abdominal pain: Secondary | ICD-10-CM | POA: Insufficient documentation

## 2022-03-07 MED ORDER — TECHNETIUM TC 99M SULFUR COLLOID
2.0500 | Freq: Once | INTRAVENOUS | Status: AC | PRN
  Administered 2022-03-07: 2.05 via ORAL

## 2022-04-04 ENCOUNTER — Encounter: Payer: Medicaid Other | Attending: Gastroenterology

## 2022-05-08 IMAGING — US US OB < 14 WEEKS - US OB TV
1 series · 13 of 28 positions shown · non-contrast
Comparison: None.

CLINICAL DATA: Vaginal bleeding. Pregnant patient. Patient is 6
weeks and 2 days pregnant based on her last menstrual period. Beta
HCG level is 424.

EXAM:
OBSTETRIC <14 WK US AND TRANSVAGINAL OB US
TECHNIQUE: Both transabdominal and transvaginal ultrasound examinations were
performed for complete evaluation of the gestation as well as the
maternal uterus, adnexal regions, and pelvic cul-de-sac.
Transvaginal technique was performed to assess early pregnancy.

[Series 1: ob us · 13 of 68 slices shown]
[im 3/68]
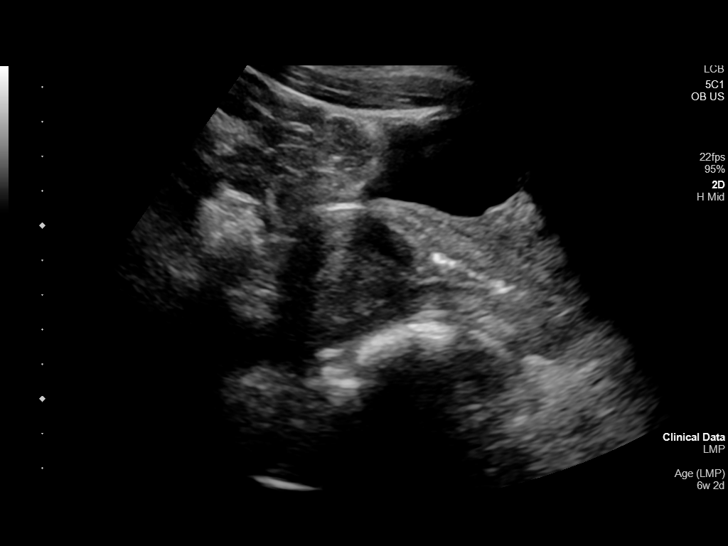
[im 8/68]
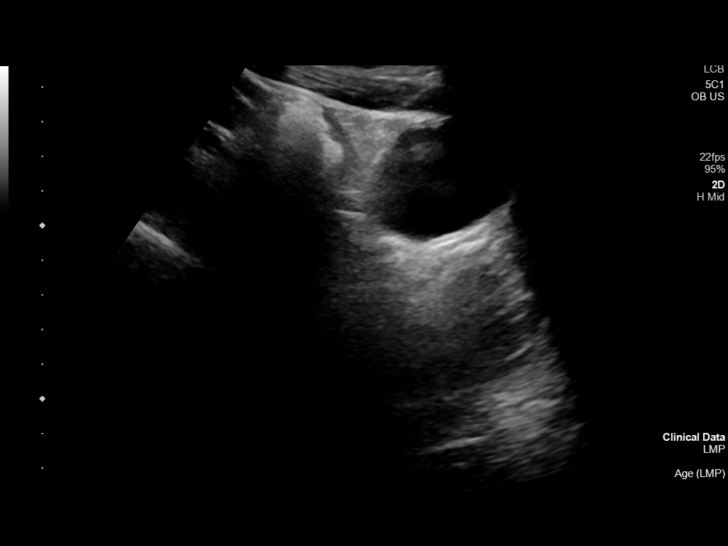
[im 13/68]
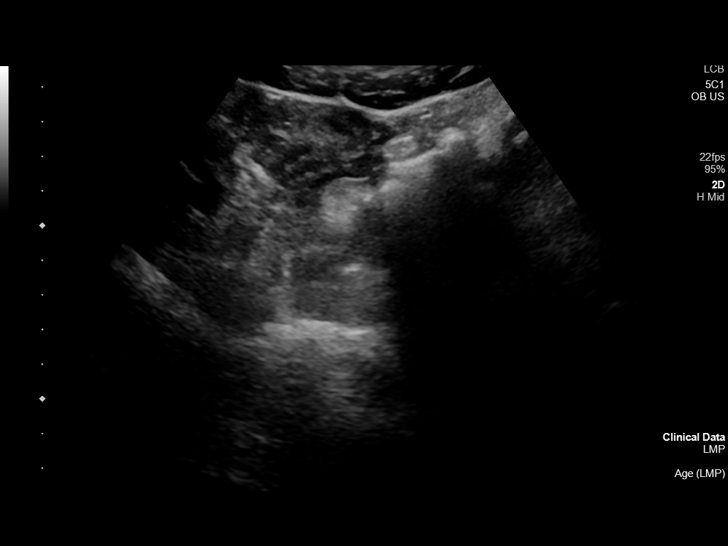
[im 18/68]
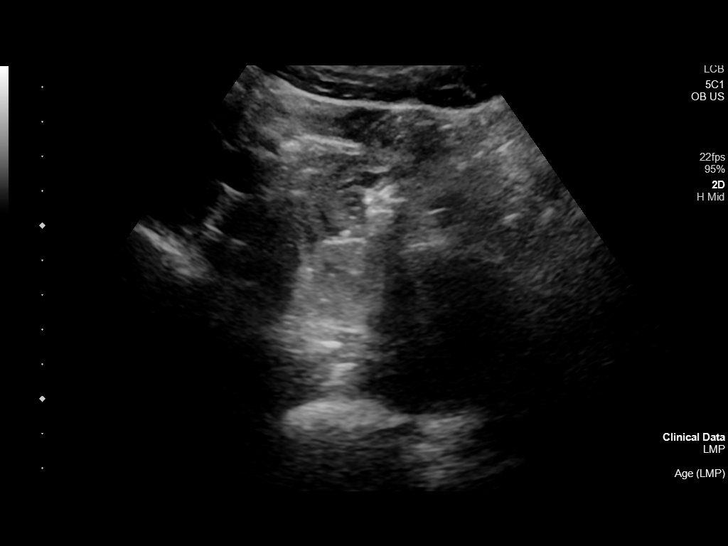
[im 23/68]
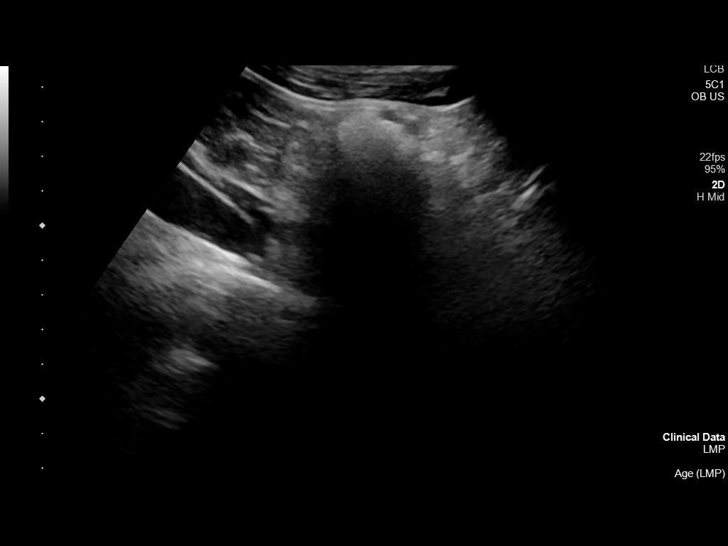
[im 28/68]
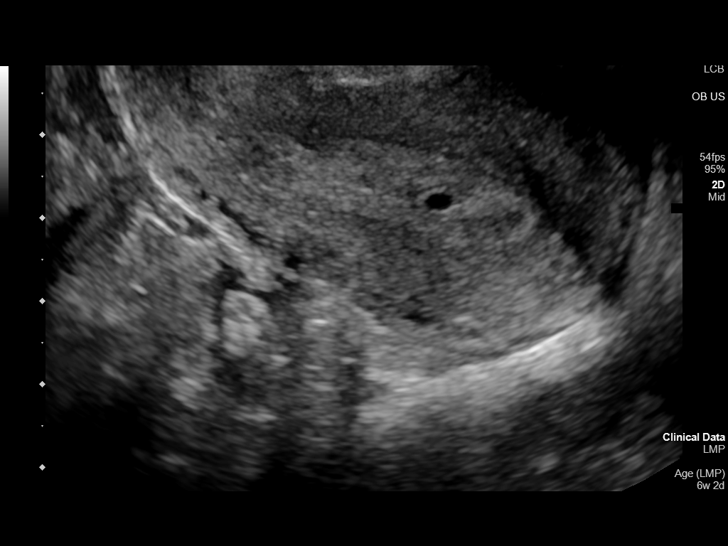
[im 35/68]
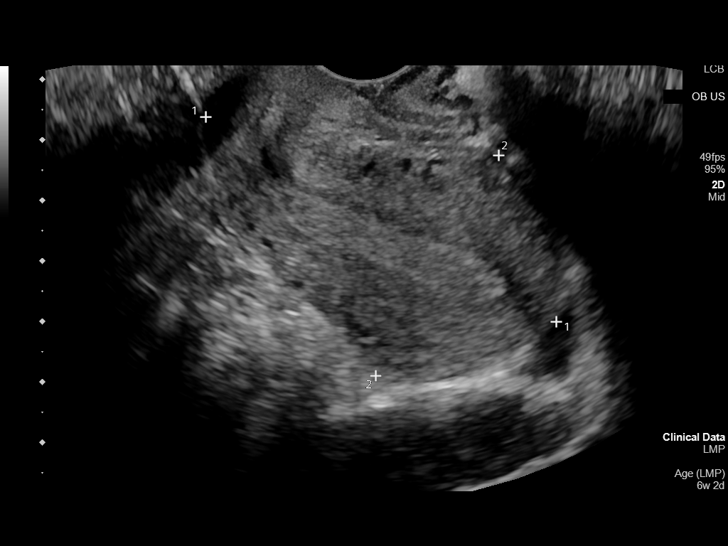
[im 40/68]
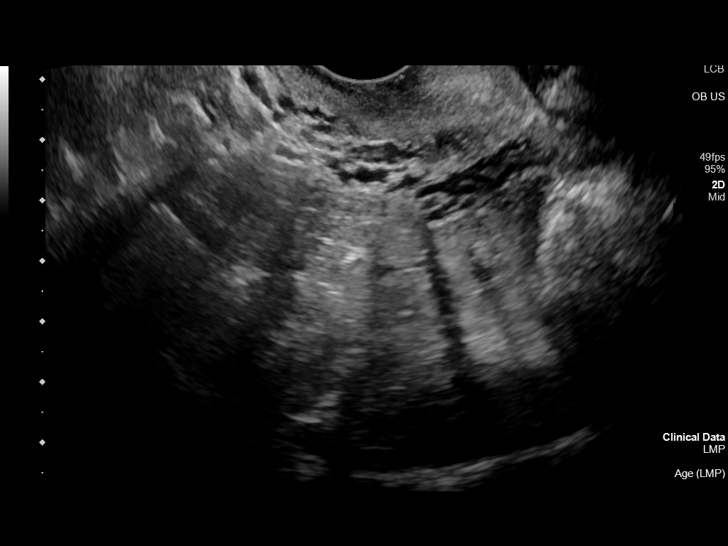
[im 45/68]
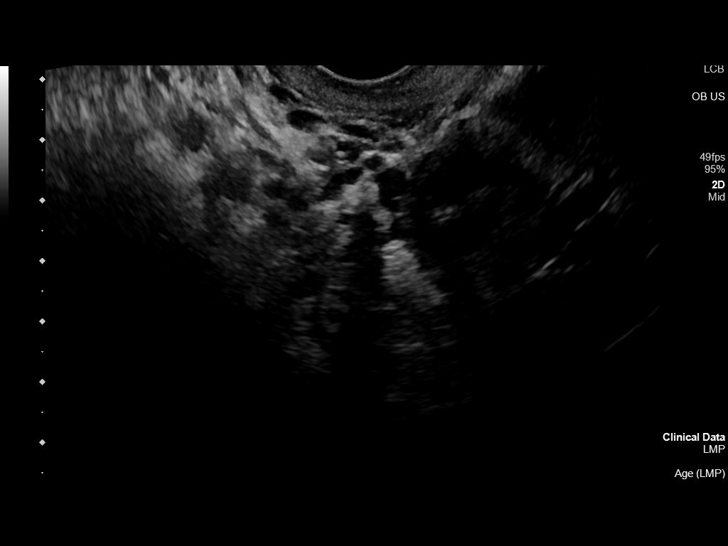
[im 50/68]
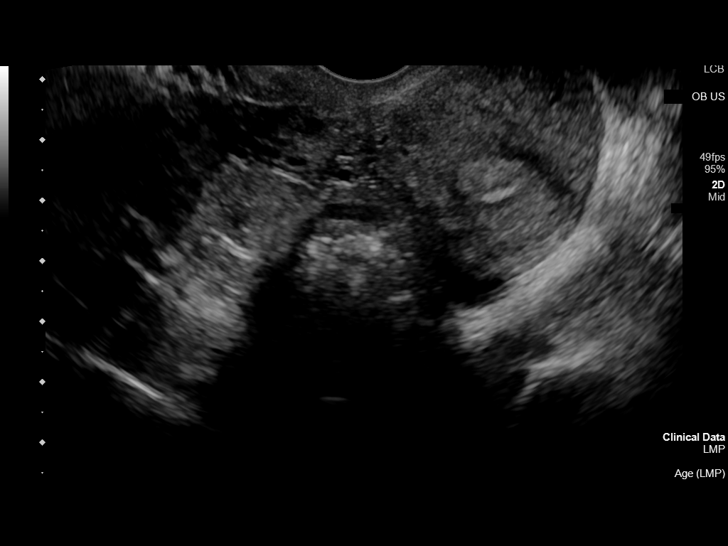
[im 55/68]
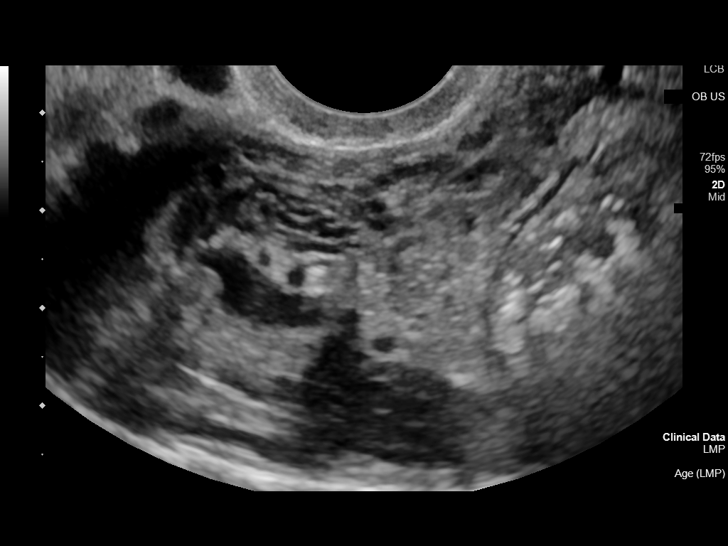
[im 60/68]
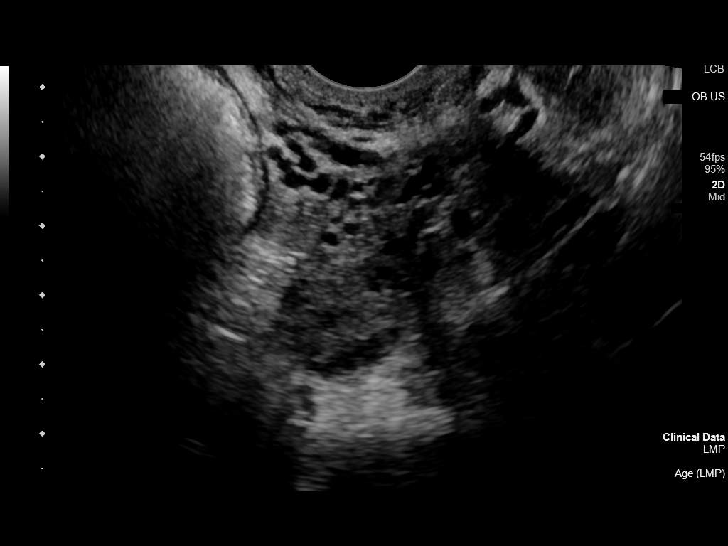
[im 65/68]
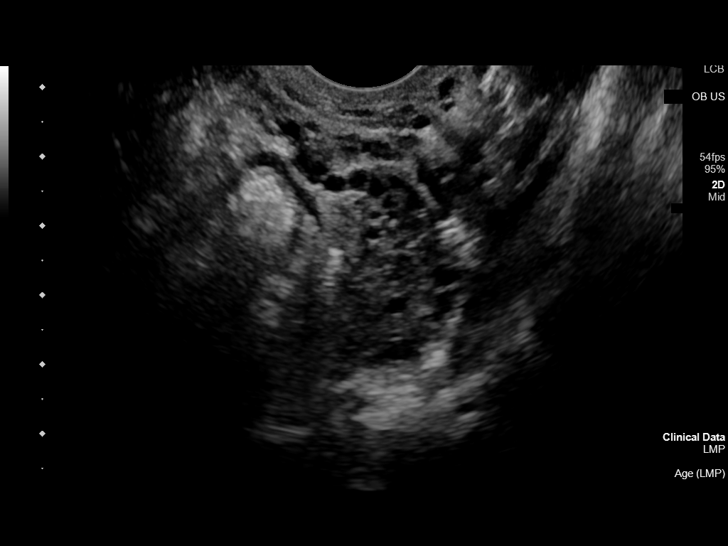

[13 of 28 positions shown; findings below may reference images not displayed]

FINDINGS: Intrauterine gestational sac: Single

Yolk sac:  Not Visualized.

Embryo:  Not Visualized.

Cardiac Activity: Not Visualized.

MSD: 3.2 mm   5 w   0 d

Subchorionic hemorrhage:  None visualized.

Maternal uterus/adnexae: No uterine masses. Cervix is closed. Right
ovary not visualized. Normal left ovary. No pelvic free fluid.
IMPRESSION: 1. Probable early intrauterine gestational sac, but no yolk sac,
fetal pole, or cardiac activity yet visualized. Recommend follow-up
quantitative B-HCG levels and follow-up US in 14 days to assess
viability. This recommendation follows SRU consensus guidelines:
Diagnostic Criteria for Nonviable Pregnancy Early in the First
Trimester. N Engl J Med 2401; [DATE].
2. No evidence of a pregnancy complication.  No acute findings.

## 2022-07-22 DIAGNOSIS — G43909 Migraine, unspecified, not intractable, without status migrainosus: Secondary | ICD-10-CM | POA: Diagnosis not present

## 2022-07-22 DIAGNOSIS — R064 Hyperventilation: Secondary | ICD-10-CM | POA: Diagnosis not present

## 2022-08-24 IMAGING — US US ABDOMEN LIMITED RUQ/ASCITES
1 series · 5 of 5 positions shown · non-contrast
Comparison: None.

CLINICAL DATA: Pelvic pain and pregnancy

EXAM:
ULTRASOUND ABDOMEN LIMITED
TECHNIQUE: Gray scale imaging of the right lower quadrant was performed to
evaluate for suspected appendicitis. Standard imaging planes and
graded compression technique were utilized.

[Series 1: us abdomen limited · 5 acquisitions, 5 frames shown]
[im 1/5]
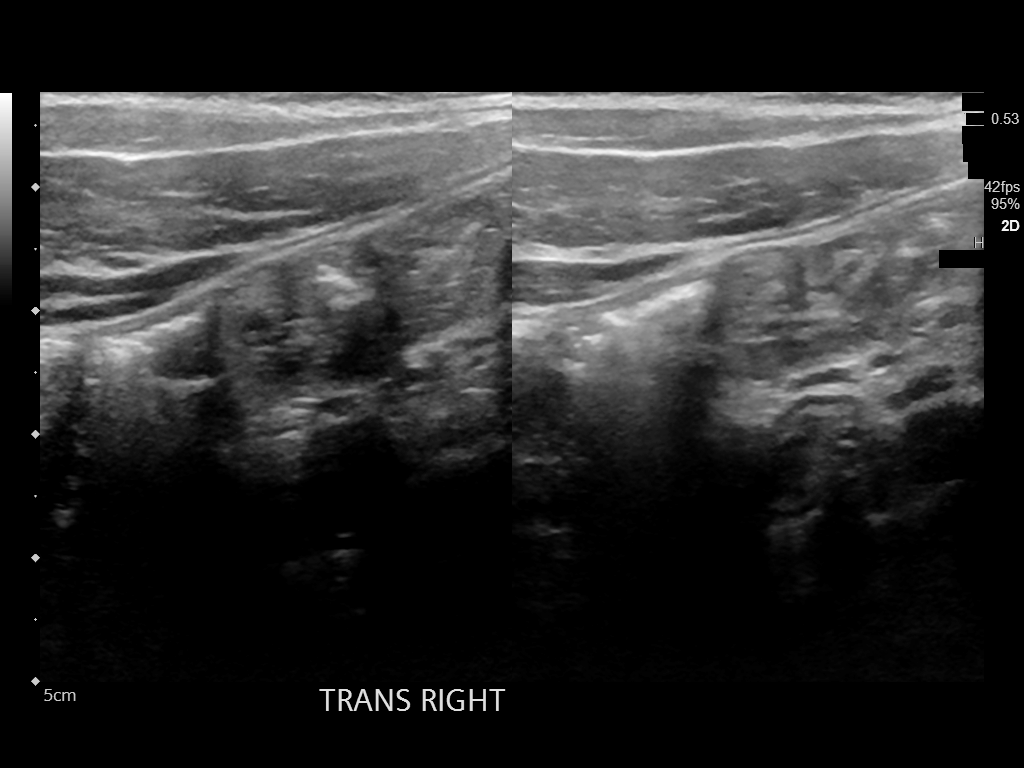
[im 2/5]
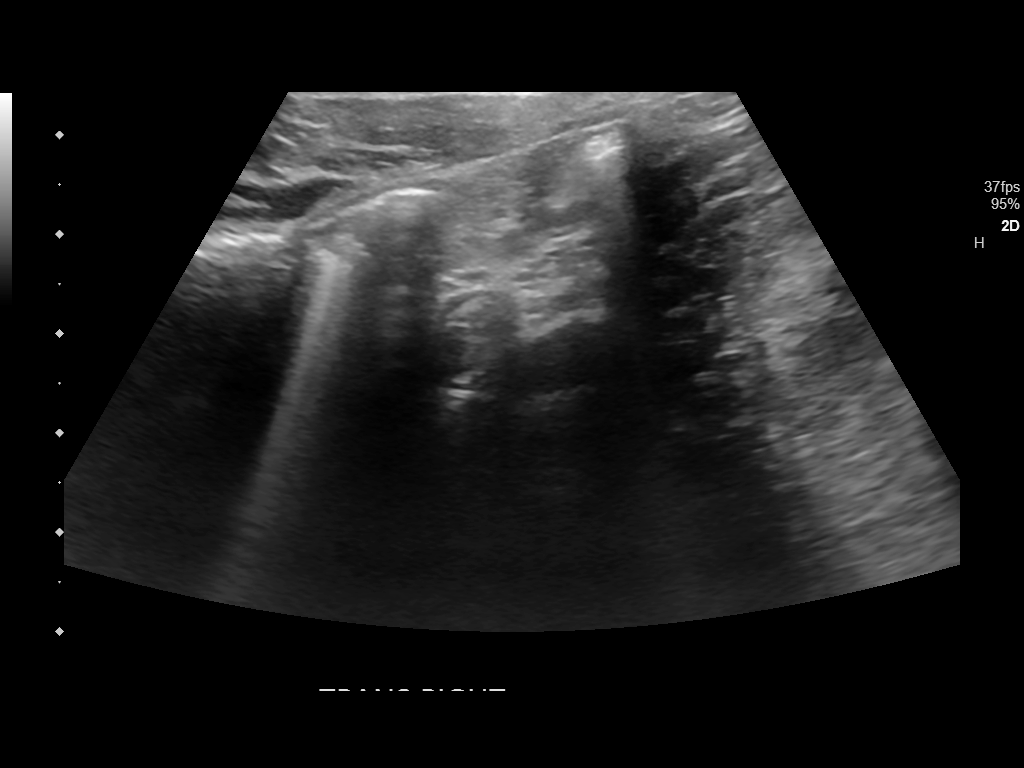
[im 3/5]
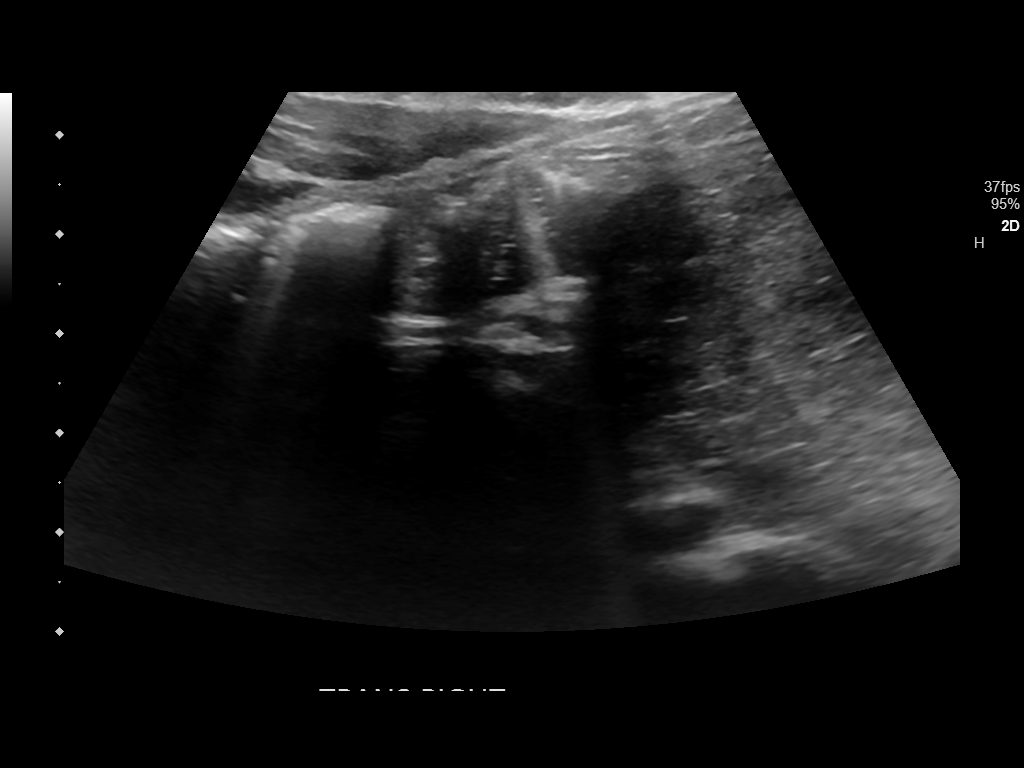
[im 4/5]
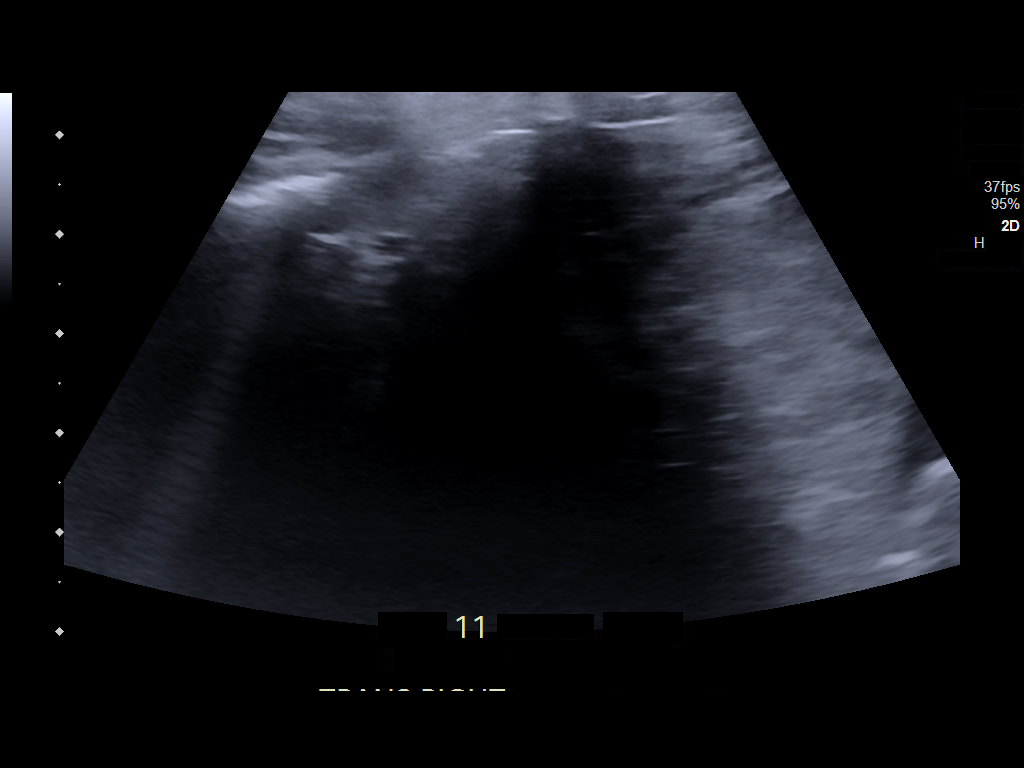
[im 5/5]
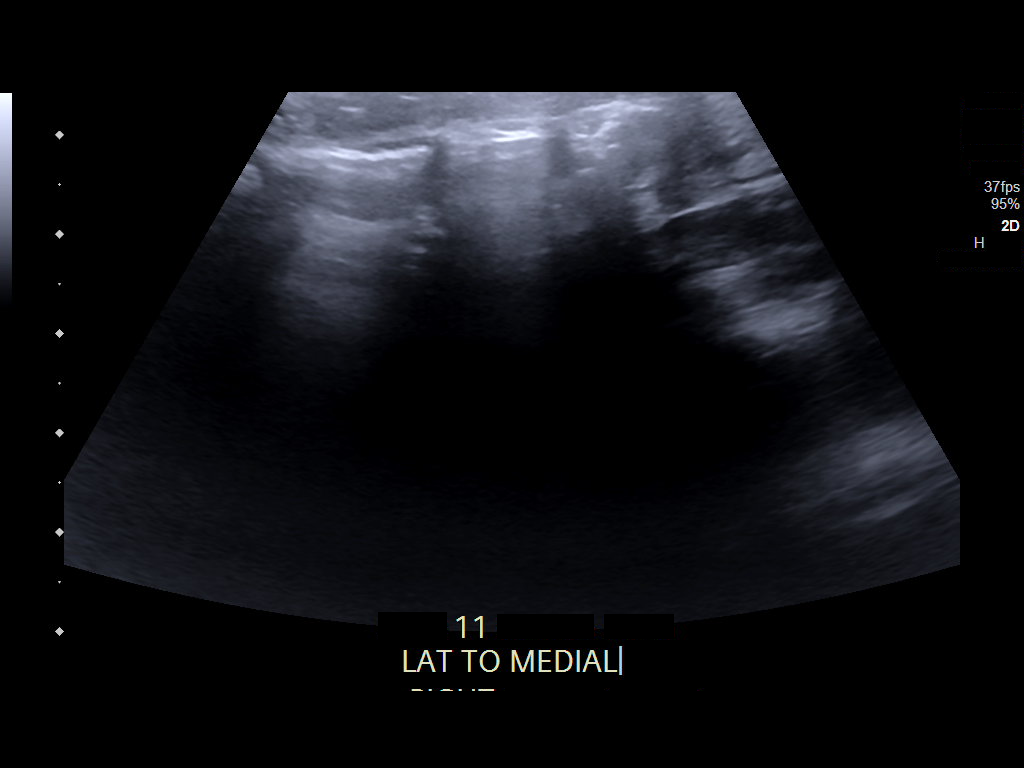

[5 of 5 positions shown; findings below may reference images not displayed]

FINDINGS: The appendix is not visualized.

Ancillary findings: None.

Factors affecting image quality: None.

Other findings: None.
IMPRESSION: Non visualization of the appendix. Non-visualization of appendix by
US does not definitely exclude appendicitis. If there is sufficient
clinical concern, consider abdomen pelvis CT with contrast for
further evaluation.

## 2022-09-26 DIAGNOSIS — R251 Tremor, unspecified: Secondary | ICD-10-CM | POA: Diagnosis not present

## 2022-09-26 DIAGNOSIS — Z3202 Encounter for pregnancy test, result negative: Secondary | ICD-10-CM | POA: Diagnosis not present

## 2022-09-26 DIAGNOSIS — R002 Palpitations: Secondary | ICD-10-CM | POA: Diagnosis not present

## 2022-09-26 DIAGNOSIS — R1013 Epigastric pain: Secondary | ICD-10-CM | POA: Diagnosis not present

## 2022-10-02 DIAGNOSIS — R9431 Abnormal electrocardiogram [ECG] [EKG]: Secondary | ICD-10-CM | POA: Diagnosis not present

## 2022-10-02 DIAGNOSIS — R002 Palpitations: Secondary | ICD-10-CM | POA: Diagnosis not present

## 2022-10-02 DIAGNOSIS — Z8639 Personal history of other endocrine, nutritional and metabolic disease: Secondary | ICD-10-CM | POA: Diagnosis not present

## 2022-10-10 ENCOUNTER — Other Ambulatory Visit: Payer: Self-pay | Admitting: Internal Medicine

## 2022-10-10 DIAGNOSIS — Z8639 Personal history of other endocrine, nutritional and metabolic disease: Secondary | ICD-10-CM

## 2022-10-13 ENCOUNTER — Ambulatory Visit
Admission: RE | Admit: 2022-10-13 | Discharge: 2022-10-13 | Disposition: A | Discharge status: Home / Self Care | Payer: Medicaid Other | Source: Ambulatory Visit | Attending: Internal Medicine | Admitting: Internal Medicine

## 2022-10-13 DIAGNOSIS — Z8639 Personal history of other endocrine, nutritional and metabolic disease: Secondary | ICD-10-CM | POA: Diagnosis not present

## 2022-10-13 DIAGNOSIS — E0590 Thyrotoxicosis, unspecified without thyrotoxic crisis or storm: Secondary | ICD-10-CM | POA: Diagnosis not present

## 2022-10-15 DIAGNOSIS — E0590 Thyrotoxicosis, unspecified without thyrotoxic crisis or storm: Secondary | ICD-10-CM | POA: Diagnosis not present

## 2022-11-12 DIAGNOSIS — Z8639 Personal history of other endocrine, nutritional and metabolic disease: Secondary | ICD-10-CM | POA: Diagnosis not present

## 2022-11-12 DIAGNOSIS — R9431 Abnormal electrocardiogram [ECG] [EKG]: Secondary | ICD-10-CM | POA: Diagnosis not present

## 2022-11-12 DIAGNOSIS — R002 Palpitations: Secondary | ICD-10-CM | POA: Diagnosis not present

## 2022-11-20 ENCOUNTER — Encounter: Payer: Self-pay | Admitting: Emergency Medicine

## 2022-11-20 ENCOUNTER — Other Ambulatory Visit: Payer: Self-pay

## 2022-11-20 ENCOUNTER — Emergency Department
Admission: EM | Admit: 2022-11-20 | Discharge: 2022-11-20 | Disposition: A | Discharge status: Home / Self Care | Payer: Medicaid Other | Attending: Emergency Medicine | Admitting: Emergency Medicine

## 2022-11-20 ENCOUNTER — Emergency Department: Payer: Medicaid Other

## 2022-11-20 DIAGNOSIS — O3680X0 Pregnancy with inconclusive fetal viability, not applicable or unspecified: Secondary | ICD-10-CM

## 2022-11-20 DIAGNOSIS — Z6711 Type A blood, Rh negative: Secondary | ICD-10-CM | POA: Diagnosis not present

## 2022-11-20 DIAGNOSIS — Z3A00 Weeks of gestation of pregnancy not specified: Secondary | ICD-10-CM | POA: Diagnosis not present

## 2022-11-20 DIAGNOSIS — O360910 Maternal care for other rhesus isoimmunization, first trimester, not applicable or unspecified: Secondary | ICD-10-CM | POA: Insufficient documentation

## 2022-11-20 DIAGNOSIS — O3480 Maternal care for other abnormalities of pelvic organs, unspecified trimester: Secondary | ICD-10-CM | POA: Diagnosis not present

## 2022-11-20 DIAGNOSIS — O209 Hemorrhage in early pregnancy, unspecified: Secondary | ICD-10-CM | POA: Diagnosis not present

## 2022-11-20 DIAGNOSIS — Z3A01 Less than 8 weeks gestation of pregnancy: Secondary | ICD-10-CM | POA: Insufficient documentation

## 2022-11-20 DIAGNOSIS — O200 Threatened abortion: Secondary | ICD-10-CM | POA: Insufficient documentation

## 2022-11-20 DIAGNOSIS — O4690 Antepartum hemorrhage, unspecified, unspecified trimester: Secondary | ICD-10-CM

## 2022-11-20 LAB — ABO/RH: ABO/RH(D): A NEG

## 2022-11-20 LAB — CBC
HCT: 40.1 % (ref 36.0–46.0)
Hemoglobin: 13.4 g/dL (ref 12.0–15.0)
MCH: 29.5 pg (ref 26.0–34.0)
MCHC: 33.4 g/dL (ref 30.0–36.0)
MCV: 88.1 fL (ref 80.0–100.0)
Platelets: 237 10*3/uL (ref 150–400)
RBC: 4.55 MIL/uL (ref 3.87–5.11)
RDW: 11.9 % (ref 11.5–15.5)
WBC: 9.8 10*3/uL (ref 4.0–10.5)
nRBC: 0 % (ref 0.0–0.2)

## 2022-11-20 LAB — HCG, QUANTITATIVE, PREGNANCY: hCG, Beta Chain, Quant, S: 76 m[IU]/mL — ABNORMAL HIGH (ref <5)

## 2022-11-20 LAB — ANTIBODY SCREEN: Antibody Screen: NEGATIVE

## 2022-11-20 LAB — POC URINE PREG, ED: Preg Test, Ur: POSITIVE — AB

## 2022-11-20 MED ORDER — RHO D IMMUNE GLOBULIN 1500 UNIT/2ML IJ SOSY
300.0000 ug | PREFILLED_SYRINGE | Freq: Once | INTRAMUSCULAR | Status: AC
  Administered 2022-11-20: 300 ug via INTRAMUSCULAR
  Filled 2022-11-20: qty 2

## 2022-11-20 NOTE — ED Provider Notes (Signed)
[facility] Provider Note    Event Date/Time   First MD Initiated Contact with Patient 11/20/22 [phone]     (approximate)   History   Vaginal Bleeding   HPI  Alyssa Rojas is a 22 y.o. female G3, P1 currently approximately [redacted] weeks pregnant who presents today for evaluation of vaginal bleeding.  Patient reports that she is approximately 6 to [redacted] weeks pregnant and thinks that she is having a miscarriage.  She had 1 previous miscarriage around the same time.  Patient reports that she had a little bit of bleeding last night, and heavier bleeding today.  She also reports that she is suprapubic abdominal pain.  She also notes that she is Rh- and needs RhoGAM.  No nausea or vomiting.  No fevers or chills.  No other vaginal discharge.  No dysuria.  Patient Active Problem List   Diagnosis Date Noted   Encounter for elective induction of labor 12/10/2020   Amniotic fluid leaking 12/08/2020   Suspected rupture of membranes not found for normal first pregnancy 10/31/2020   Supervision of normal pregnancy 10/05/2020   Maternal varicella, non-immune 06/05/2020   Rh negative state in antepartum period 06/05/2020          Physical Exam   Triage Vital Signs: ED Triage Vitals  Encounter Vitals Group     BP 11/20/22 0812 (!) 130/91     Systolic BP Percentile --      Diastolic BP Percentile --      Pulse Rate 11/20/22 0812 92     Resp 11/20/22 0812 16     Temp 11/20/22 0812 98.4 F (36.9 C)     Temp Source 11/20/22 0812 Oral     SpO2 11/20/22 0812 100 %     Weight 11/20/22 0813 99 lb (44.9 kg)     Height --      Head Circumference --      Peak Flow --      Pain Score 11/20/22 0813 5     Pain Loc --      Pain Education --      Exclude from Growth Chart --     Most recent vital signs: Vitals:   11/20/22 0812 11/20/22 1231  BP: (!) 130/91 128/88  Pulse: 92 80  Resp: 16 16  Temp: 98.4 F (36.9 C) 98 F (36.7 C)  SpO2: 100% 100%    Physical Exam Vitals  and nursing note reviewed.  Constitutional:      General: Awake and alert. No acute distress.    Appearance: Normal appearance. The patient is normal weight.  HENT:     Head: Normocephalic and atraumatic.     Mouth: Mucous membranes are moist.  Eyes:     General: PERRL. Normal EOMs        Right eye: No discharge.        Left eye: No discharge.     Conjunctiva/sclera: Conjunctivae normal.  Cardiovascular:     Rate and Rhythm: Normal rate and regular rhythm.     Pulses: Normal pulses.  Pulmonary:     Effort: Pulmonary effort is normal. No respiratory distress.     Breath sounds: Normal breath sounds.  Abdominal:     Abdomen is soft. There is no abdominal tenderness. No rebound or guarding. No distention. Musculoskeletal:        General: No swelling. Normal range of motion.     Cervical back: Normal range of motion and neck supple.  Skin:    General: Skin is warm and dry.     Capillary Refill: Capillary refill takes less than 2 seconds.     Findings: No rash.  Neurological:     Mental Status: The patient is awake and alert.      ED Results / Procedures / Treatments   Labs (all labs ordered are listed, but only abnormal results are displayed) Labs Reviewed  HCG, QUANTITATIVE, PREGNANCY - Abnormal; Notable for the following components:      Result Value   hCG, Beta Chain, Quant, S 76 (*)    All other components within normal limits  POC URINE PREG, ED - Abnormal; Notable for the following components:   Preg Test, Ur POSITIVE (*)    All other components within normal limits  CBC  ABO/RH  ANTIBODY SCREEN  RHOGAM INJECTION     EKG     RADIOLOGY I independently reviewed and interpreted imaging and agree with radiologists findings.     PROCEDURES:  Critical Care performed:   Procedures   MEDICATIONS ORDERED IN ED: Medications  rho (d) immune globulin (RHIG/RHOPHYLAC) injection 300 mcg (300 mcg Intramuscular Given 11/20/22 1038)     IMPRESSION / MDM /  ASSESSMENT AND PLAN / ED COURSE  I reviewed the triage vital signs and the nursing notes.   Differential diagnosis includes, but is not limited to, spontaneous abortion, inevitable abortion, incomplete abortion, ectopic pregnancy, subchorionic hemorrhage, cervical ectropion.  Patient is awake and alert, hemodynamically stable and afebrile.  Labs obtained in triage reveals stable H&H.  Her urine pregnancy is positive, though her serum hCG is only 74.  There is no previous within this pregnancy to compare to.  She is in fact Rh-, RhoGAM was ordered.  Given that this is a pregnancy of unknown location at this time, will obtain ultrasound.  However, I do not suspect that we will see anything on ultrasound given her low hCG.  Patient is aware of this.  Patient wished to leave prior to ultrasound results.  She was advised that she can find these on MyChart, though she may need to return if it shows something abnormal.  We again discussed that she needs to have a repeat hCG to ensure that it continues to downtrend and we discussed the timeframe for this.  We also discussed strict return precautions.  Ultrasound did result after patient departed, does reveal a pregnancy of unknown location.  I do suspect that this is a miscarriage given her low hCG level and lack of unilateral tenderness.  Patient plans to follow-up in a couple of days for repeat hCG.  She understands return precautions in the meantime.   Patient's presentation is most consistent with acute complicated illness / injury requiring diagnostic workup.   Clinical Course as of 11/20/22 1431  Thu Nov 20, 2022  1220 Patient does not wish to wait for the results of her ultrasound and is requesting discharge [JP]    Clinical Course User Index [JP] Dorthy Hustead, Herb Grays, PA-C     FINAL CLINICAL IMPRESSION(S) / ED DIAGNOSES   Final diagnoses:  Vaginal bleeding in pregnancy  Threatened miscarriage  Pregnancy of unknown anatomic location     Rx  / DC Orders   ED Discharge Orders     None        Note:  This document was prepared using Dragon voice recognition software and may include unintentional dictation errors.   Jackelyn Hoehn, PA-C 11/20/22 1431    Jene Every,  MD 11/20/22 1436

## 2022-11-20 NOTE — ED Triage Notes (Signed)
C/O vaginal bleeding this morning. STates started spotting yesterday.  LMP:  10/11/2022.  + home pregnancy test.  G3 P1  States RH negative

## 2022-11-20 NOTE — Discharge Instructions (Signed)
He did not wish to wait for the results of your ultrasound.  You may find this result on MyChart.  You were given RhoGAM.  Please return for any new, worsening, or change in symptoms or other concerns.  It was a pleasure caring for you today.

## 2022-11-21 LAB — RHOGAM INJECTION: Unit division: 0

## 2022-12-03 IMAGING — US US OB FOLLOW-UP
1 series · 15 of 28 positions shown · non-contrast
Comparison: none

CLINICAL DATA: Pregnancy.  Follow-up anatomy.

EXAM:
OBSTETRIC 14+ WK ULTRASOUND FOLLOW-UP

[Series 1: us ob follow up · 15 of 59 slices shown]
[im 1/59]
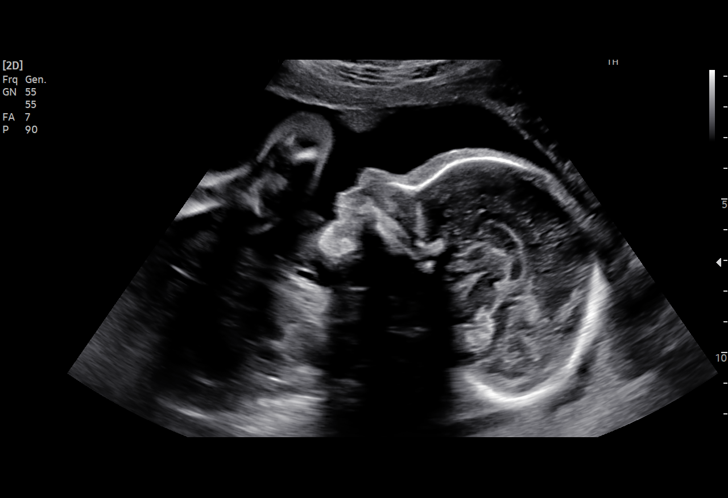
[im 5/59]
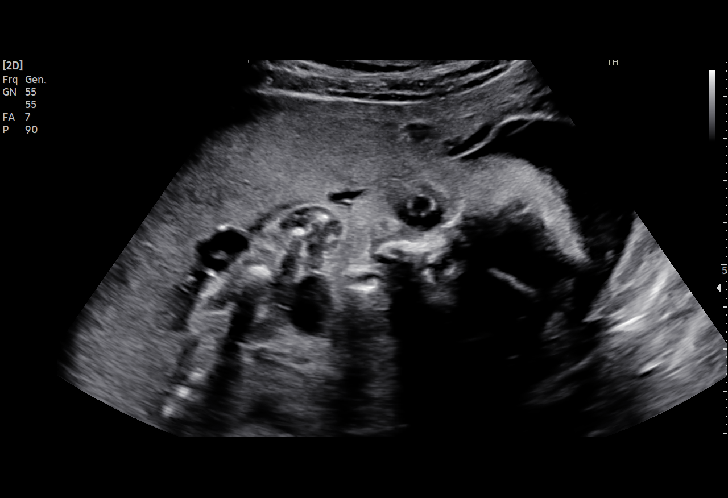
[im 9/59]
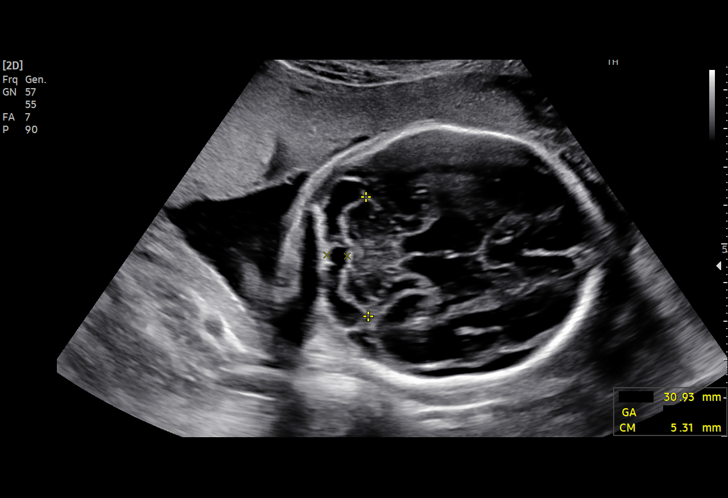
[im 13/59]
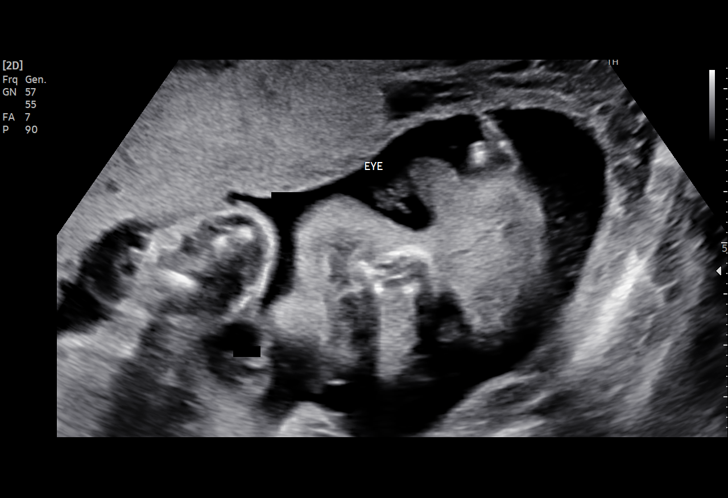
[im 18/59]
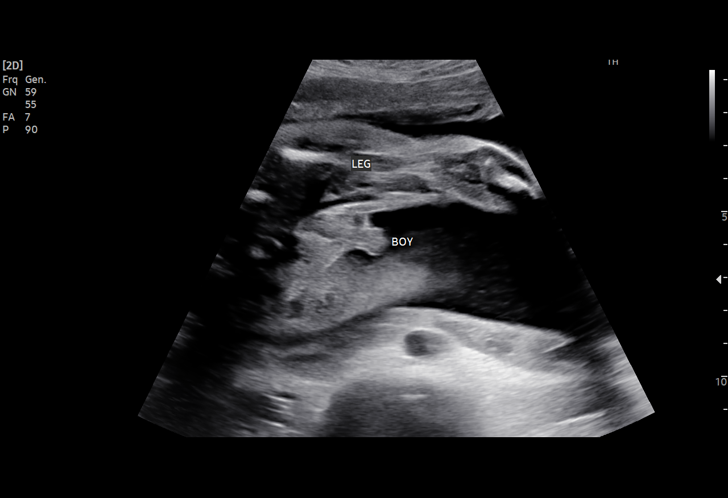
[im 22/59]
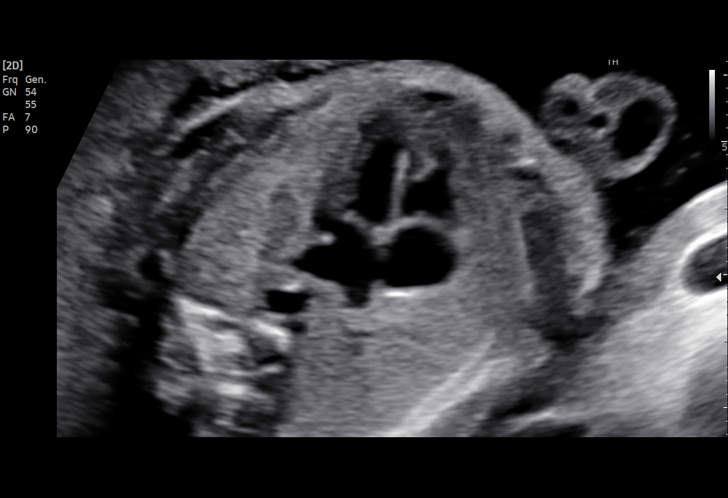
[im 26/59]
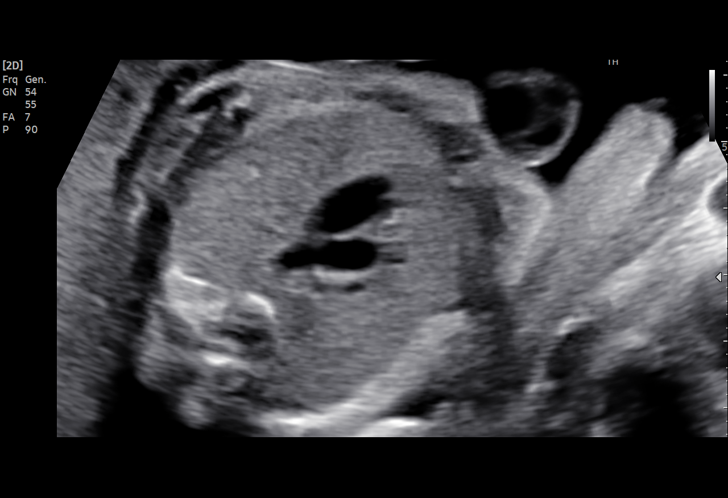
[im 31/59]
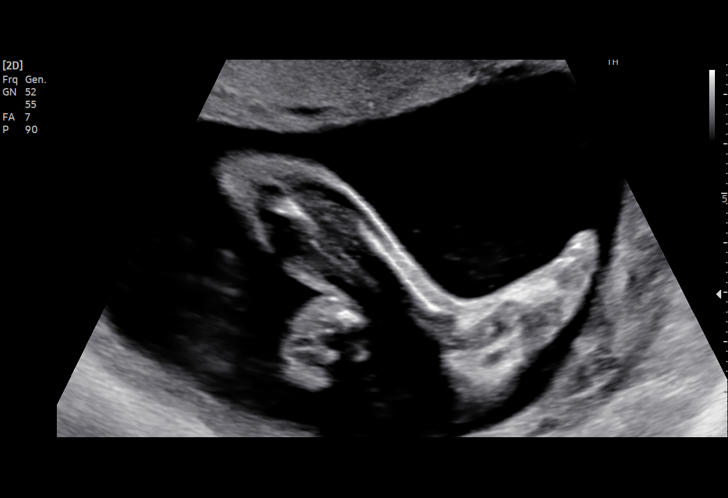
[im 33/59]
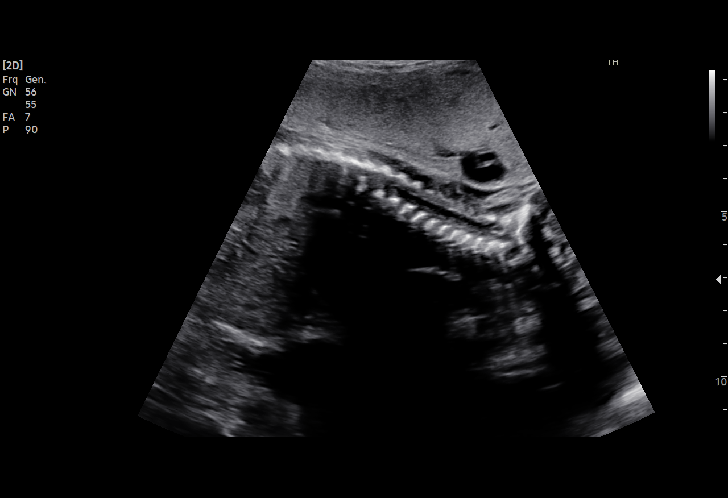
[im 37/59]
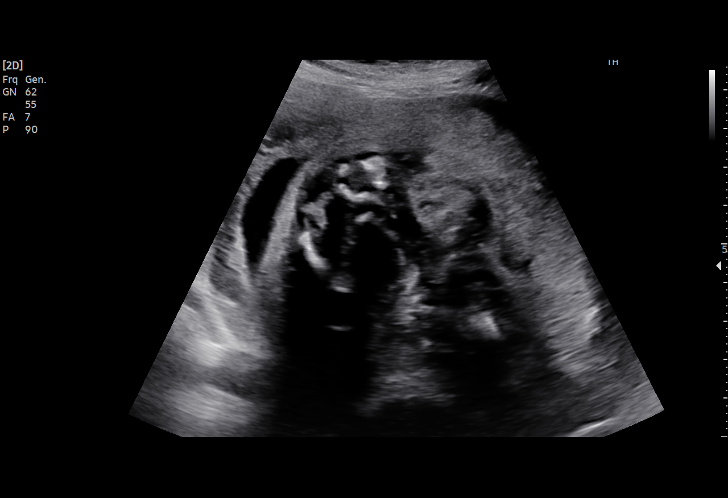
[im 41/59]
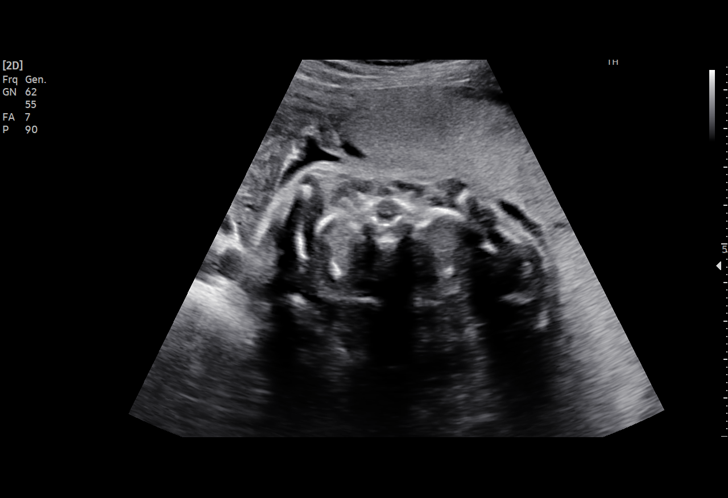
[im 46/59]
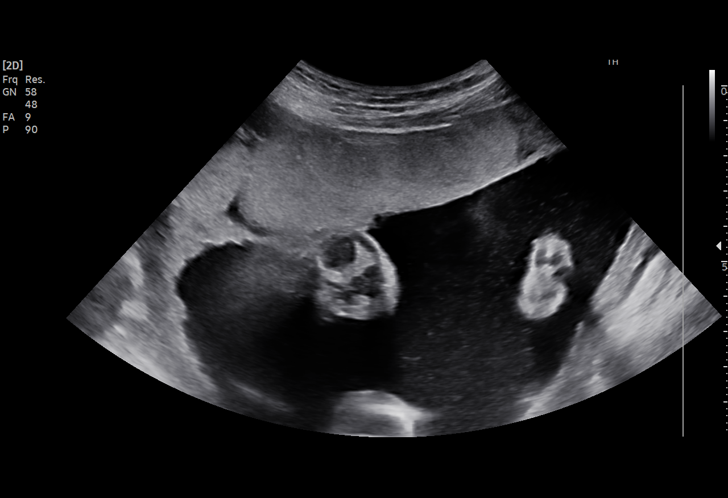
[im 50/59]
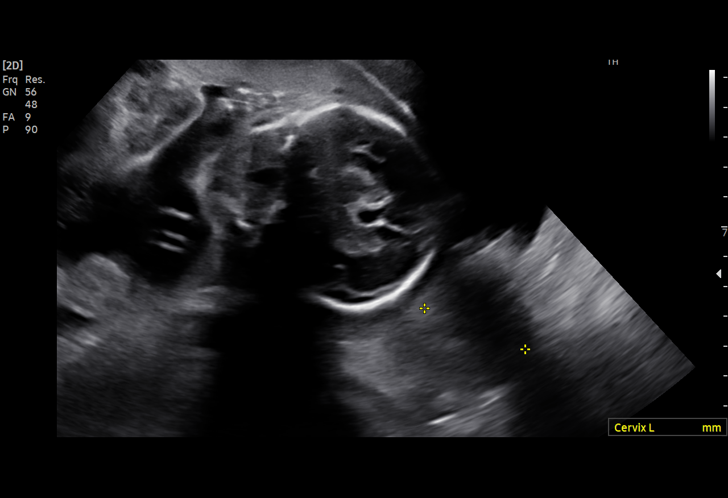
[im 54/59]
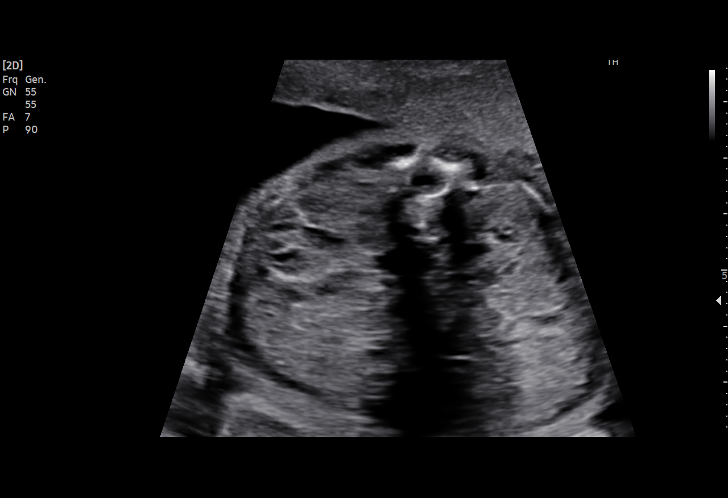
[im 59/59]
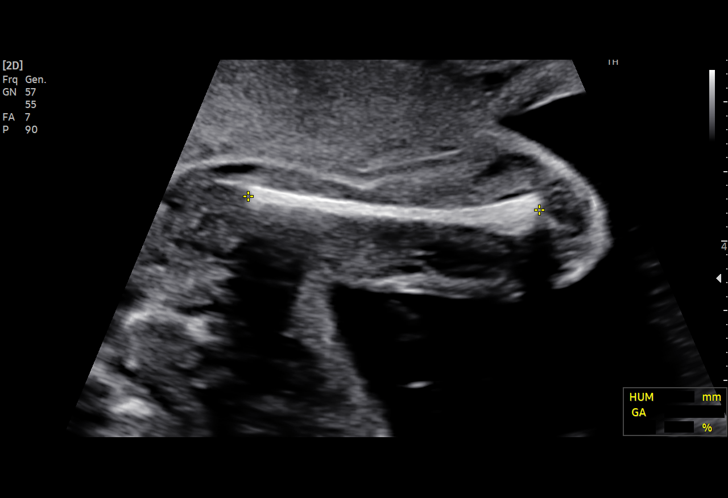

[15 of 28 positions shown; findings below may reference images not displayed]

FINDINGS: Number of Fetuses: 1

Heart Rate:  148 bpm

Movement: Yes

Presentation: Cephalic

Previa: No

Placental Location: Anterior

Amniotic Fluid (Subjective): Normal

Amniotic Fluid (Objective):

Vertical pocket 6.2cm

FETAL BIOMETRY

BPD:  6.5cm 26w 3d

HC:    24.4cm 26w 3d

AC:    22.0cm 26w 3d

FL:    4.7cm 25w 4d

Current Mean GA: 26w 1d US EDC: 12/06/2020

Assigned GA: 25w 5d Assigned EDC: 12/09/2020

Estimated Fetal Weight:  895g 57%ile

FETAL ANATOMY

Lateral Ventricles: Previously seen

Thalami/CSP: Appears normal

Posterior Fossa: Appears normal

Nuchal Region: Appears normal

Upper Lip: Previously seen

Spine: Appears normal

4 Chamber Heart on Left: Previously seen

LVOT: Previously seen

RVOT: Previously seen

Stomach on Left: Previously seen

3 Vessel Cord: Previously seen

Cord Insertion site: Previously seen

Kidneys: Previously seen

Bladder: Previously seen

Extremities: Previously seen

Sex: Previously Seen

Technical Limitations: None.

Maternal Findings:

Cervix:  Closed.  3.6 cm.
IMPRESSION: 1. Single live intrauterine gestation in cephalic presentation.
2. Assigned gestational age of 25 weeks 5 days. Adequate interval
growth.
3. Adequate visualization of the fetal brain and spine. Fetal
anatomic survey is now complete.

## 2023-01-21 NOTE — L&D Delivery Note (Signed)
 Delivery Note At 9:43 AM a viable and female  child was delivered via Vaginal, Spontaneous (Presentation:      ).  APGAR: 8/9, ; weight  .#  6/15 Placenta status: Spontaneous, Intact.  Cord: 3 vessels with the following complications: None.   Anesthesia: Epidural Episiotomy:  none Lacerations:  none Suture Repair: none Est. Blood Loss (mL):   50 cc  Mom to postpartum.  Baby to Couplet care / Skin to Skin.  Mother pushed 3 sets of 3 . Delivery of head controlled . Loose nuchal cord reduced . Shoulders and body delivered without difficulty . Vigorous female dried on mothers abdomen . Delayed cord clamping 60 sec. Nursery staff in attendance . Apgars 8/9 Placenta delivered shortly after and good uterine tone  No lacerations. Mother tolerated well  Josiah Wojtaszek J Ricki Vanhandel 01/04/2024, 9:53 AM

## 2023-02-17 DIAGNOSIS — H52223 Regular astigmatism, bilateral: Secondary | ICD-10-CM | POA: Diagnosis not present

## 2023-02-18 DIAGNOSIS — H5213 Myopia, bilateral: Secondary | ICD-10-CM | POA: Diagnosis not present

## 2023-04-03 DIAGNOSIS — H52223 Regular astigmatism, bilateral: Secondary | ICD-10-CM | POA: Diagnosis not present

## 2023-05-13 DIAGNOSIS — Z6791 Unspecified blood type, Rh negative: Secondary | ICD-10-CM | POA: Diagnosis not present

## 2023-05-13 DIAGNOSIS — O4691 Antepartum hemorrhage, unspecified, first trimester: Secondary | ICD-10-CM | POA: Diagnosis not present

## 2023-05-13 DIAGNOSIS — Z8759 Personal history of other complications of pregnancy, childbirth and the puerperium: Secondary | ICD-10-CM | POA: Diagnosis not present

## 2023-05-13 DIAGNOSIS — O26891 Other specified pregnancy related conditions, first trimester: Secondary | ICD-10-CM | POA: Diagnosis not present

## 2023-05-15 DIAGNOSIS — Z3201 Encounter for pregnancy test, result positive: Secondary | ICD-10-CM | POA: Diagnosis not present

## 2023-05-15 DIAGNOSIS — Z8759 Personal history of other complications of pregnancy, childbirth and the puerperium: Secondary | ICD-10-CM | POA: Diagnosis not present

## 2023-05-26 DIAGNOSIS — O99111 Other diseases of the blood and blood-forming organs and certain disorders involving the immune mechanism complicating pregnancy, first trimester: Secondary | ICD-10-CM | POA: Diagnosis not present

## 2023-05-26 DIAGNOSIS — O219 Vomiting of pregnancy, unspecified: Secondary | ICD-10-CM | POA: Diagnosis not present

## 2023-05-26 DIAGNOSIS — D72829 Elevated white blood cell count, unspecified: Secondary | ICD-10-CM | POA: Diagnosis not present

## 2023-05-26 DIAGNOSIS — N8311 Corpus luteum cyst of right ovary: Secondary | ICD-10-CM | POA: Diagnosis not present

## 2023-05-26 DIAGNOSIS — Z3A01 Less than 8 weeks gestation of pregnancy: Secondary | ICD-10-CM | POA: Diagnosis not present

## 2023-06-01 ENCOUNTER — Other Ambulatory Visit: Payer: Self-pay

## 2023-06-01 ENCOUNTER — Encounter: Payer: Self-pay | Admitting: *Deleted

## 2023-06-01 ENCOUNTER — Emergency Department
Admission: EM | Admit: 2023-06-01 | Discharge: 2023-06-01 | Disposition: A | Discharge status: Home / Self Care | Attending: Emergency Medicine | Admitting: Emergency Medicine

## 2023-06-01 DIAGNOSIS — Z3A01 Less than 8 weeks gestation of pregnancy: Secondary | ICD-10-CM | POA: Insufficient documentation

## 2023-06-01 DIAGNOSIS — O210 Mild hyperemesis gravidarum: Secondary | ICD-10-CM | POA: Diagnosis not present

## 2023-06-01 DIAGNOSIS — O219 Vomiting of pregnancy, unspecified: Secondary | ICD-10-CM | POA: Diagnosis present

## 2023-06-01 LAB — COMPREHENSIVE METABOLIC PANEL WITH GFR
ALT: 19 U/L (ref 0–44)
AST: 25 U/L (ref 15–41)
Albumin: 4.5 g/dL (ref 3.5–5.0)
Alkaline Phosphatase: 33 U/L — ABNORMAL LOW (ref 38–126)
Anion gap: 11 (ref 5–15)
BUN: 8 mg/dL (ref 6–20)
CO2: 20 mmol/L — ABNORMAL LOW (ref 22–32)
Calcium: 9.6 mg/dL (ref 8.9–10.3)
Chloride: 102 mmol/L (ref 98–111)
Creatinine, Ser: 0.66 mg/dL (ref 0.44–1.00)
GFR, Estimated: >60 mL/min (ref >60)
Glucose, Bld: 121 mg/dL — ABNORMAL HIGH (ref 70–99)
Potassium: 3.6 mmol/L (ref 3.5–5.1)
Sodium: 133 mmol/L — ABNORMAL LOW (ref 135–145)
Total Bilirubin: 1.2 mg/dL (ref 0.0–1.2)
Total Protein: 7.5 g/dL (ref 6.5–8.1)

## 2023-06-01 LAB — URINALYSIS, ROUTINE W REFLEX MICROSCOPIC
Bilirubin Urine: NEGATIVE
Glucose, UA: NEGATIVE mg/dL
Hgb urine dipstick: NEGATIVE
Ketones, ur: 80 mg/dL — AB
Leukocytes,Ua: NEGATIVE
Nitrite: NEGATIVE
Protein, ur: 30 mg/dL — AB
Specific Gravity, Urine: 1.021 (ref 1.005–1.030)
pH: 5 (ref 5.0–8.0)

## 2023-06-01 LAB — CBC
HCT: 40.1 % (ref 36.0–46.0)
Hemoglobin: 13.5 g/dL (ref 12.0–15.0)
MCH: 30.3 pg (ref 26.0–34.0)
MCHC: 33.7 g/dL (ref 30.0–36.0)
MCV: 89.9 fL (ref 80.0–100.0)
Platelets: 264 10*3/uL (ref 150–400)
RBC: 4.46 MIL/uL (ref 3.87–5.11)
RDW: 11.9 % (ref 11.5–15.5)
WBC: 17.8 10*3/uL — ABNORMAL HIGH (ref 4.0–10.5)
nRBC: 0 % (ref 0.0–0.2)

## 2023-06-01 LAB — LIPASE, BLOOD: Lipase: 25 U/L (ref 11–51)

## 2023-06-01 LAB — HCG, QUANTITATIVE, PREGNANCY: hCG, Beta Chain, Quant, S: 198416 m[IU]/mL — ABNORMAL HIGH (ref <5)

## 2023-06-01 MED ORDER — ONDANSETRON 4 MG PO TBDP: 4.0000 mg | ORAL_TABLET | Freq: Three times a day (TID) | ORAL | 0 refills | Status: DC | PRN

## 2023-06-01 MED ORDER — SODIUM CHLORIDE 0.9 % IV BOLUS
1000.0000 mL | Freq: Once | INTRAVENOUS | Status: AC
  Administered 2023-06-01: 1000 mL via INTRAVENOUS

## 2023-06-01 MED ORDER — ONDANSETRON HCL 4 MG/2ML IJ SOLN
4.0000 mg | Freq: Once | INTRAMUSCULAR | Status: AC | PRN
  Administered 2023-06-01: 4 mg via INTRAVENOUS
  Filled 2023-06-01: qty 2

## 2023-06-01 NOTE — ED Provider Notes (Signed)
 [facility] Provider Note    Event Date/Time   First MD Initiated Contact with Patient 06/01/23 2316     (approximate)   History   Morning Sickness   HPI  Alyssa Rojas is a 23 y.o. female  [id number] approximately [redacted] weeks pregnant who presents to the emergency department from home with a chief complaint of nausea/vomiting.  History of hyperemesis gravidarum in her previous pregnancies.  Reports vomiting times several weeks.  Seen at [facility] last week and had an ultrasound which demonstrates IUP.  States IV fluids and IV nausea medicine always makes her feel better.  Currently has Reglan  and Phenergan  suppositories at home.  Endorses decreased urination.  Denies fever/chills, chest pain, shortness of breath, abdominal/pelvic pain, vaginal bleeding, dysuria, diarrhea or dizziness.     Past Medical History   Past Medical History:  Diagnosis Date   Anxiety    Headache(784.0)    Migraines      Active Problem List   Patient Active Problem List   Diagnosis Date Noted   Encounter for elective induction of labor 12/10/2020   Amniotic fluid leaking 12/08/2020   Suspected rupture of membranes not found for normal first pregnancy 10/31/2020   Supervision of normal pregnancy 10/05/2020   Maternal varicella, non-immune 06/05/2020   Rh negative state in antepartum period 06/05/2020     Past Surgical History   Past Surgical History:  Procedure Laterality Date   ESOPHAGOGASTRODUODENOSCOPY (EGD) WITH PROPOFOL  N/A 01/21/2022   Procedure: ESOPHAGOGASTRODUODENOSCOPY (EGD) WITH PROPOFOL ;  Surgeon: Shane Darling, MD;  Location: [facility] ENDOSCOPY;  Service: Endoscopy;  Laterality: N/A;   TONSILLECTOMY AND ADENOIDECTOMY  2012   Done at [facility] Medications   Prior to Admission medications   Medication Sig Start Date End Date Taking? Authorizing Provider  ondansetron  (ZOFRAN -ODT) 4 MG disintegrating tablet Take 1 tablet (4 mg total) by mouth every 8 (eight)  hours as needed for nausea or vomiting. 06/01/23  Yes Talor Cheema J, MD  acetaminophen  (TYLENOL ) 325 MG tablet Take 2 tablets (650 mg total) by mouth every 4 (four) hours as needed (for pain scale < 4). 12/12/20   Auston Left, CNM  benzocaine -Menthol  (DERMOPLAST) 20-0.5 % AERO Apply 1 application topically as needed for irritation (perineal discomfort). 12/12/20   Auston Left, CNM  ferrous gluconate  [facility]) 240 (27 FE) MG tablet Take 1 tablet (240 mg total) by mouth in the morning and at bedtime. 12/12/20   Auston Left, CNM  ibuprofen  (ADVIL ) 600 MG tablet Take 1 tablet (600 mg total) by mouth every 6 (six) hours. 12/12/20   Auston Left, CNM  omeprazole  (PRILOSEC  OTC) 20 MG tablet Take 1 tablet (20 mg total) by mouth daily. 09/27/21 09/27/22  Twilla Galea, MD  Prenatal Vit-Fe Fumarate-FA (PRENATAL VITAMINS) 28-0.8 MG TABS Take 1 tablet by mouth daily. Patient not taking: Reported on 01/21/2022 04/14/20   Loman Risk, MD  promethazine  (PHENERGAN ) 12.5 MG tablet Take 1 tablet (12.5 mg total) by mouth every 6 (six) hours as needed for nausea or vomiting. 09/27/21   Twilla Galea, MD  senna-docusate (SENOKOT-S) 8.6-50 MG tablet Take 2 tablets by mouth daily. 12/12/20   Auston Left, CNM  witch hazel-glycerin  (TUCKS) pad Apply 1 application topically as needed for hemorrhoids (for pain). 12/12/20   Auston Left, CNM  cetirizine (ZYRTEC) 10 MG tablet Take 10 mg by mouth. Patient not taking: No sig reported 11/21/13 04/14/20  [provider]  Levonorgestrel-Ethinyl Estradiol (AMETHIA,CAMRESE) 0.15-0.03 &0.01 MG tablet Take 1 tablet by mouth at bedtime. Patient not taking: No sig reported 06/25/16 04/14/20  Zenobia Hila, MD  nortriptyline (PAMELOR) 10 MG capsule 1 cap po qhs x7d, then increase to 2 caps po qhs Patient not taking: No sig reported 12/26/15 04/14/20  [provider]  omeprazole  (PRILOSEC ) 20 MG capsule Take 20 mg  by mouth 2 (two) times daily.  04/14/20  [provider]  ranitidine (ZANTAC) 300 MG tablet Take 1 Tablet (300 MG Total) by Mouth ONE (1) TIME daily. 02/13/16 04/14/20  [provider]  rizatriptan (MAXALT) 10 MG tablet Take 10 mg by mouth. Patient not taking: No sig reported 04/07/16 04/14/20  [provider]     Allergies  Patient has no known allergies.   Family History   Family History  Problem Relation Age of Onset   Healthy Mother    Stroke Father    Hypertension Maternal Grandmother    Stroke Paternal Grandfather    Migraines Paternal Grandfather    Hypertension Paternal Grandfather      Physical Exam  Triage Vital Signs: ED Triage Vitals 06/01/23 2036  Encounter Vitals Group     BP 104/71     Systolic BP Percentile      Diastolic BP Percentile      Pulse      Resp 20     Temp 98 F (36.7 C)     Temp src      SpO2 100 %     Weight      Height      Head Circumference      Peak Flow      Pain Score 6     Pain Loc      Pain Education      Exclude from Growth Chart     Updated Vital Signs: BP 104/71   Temp 98 F (36.7 C)   Resp 20   LMP 04/06/2023   SpO2 100%    General: Awake, no distress.  CV:  RRR.  Good peripheral perfusion.  Resp:  Normal effort.  CTAB. Abd:  Nontender to light or deep palpation.  No distention.  Other:  No truncal vesicles.   ED Results / Procedures / Treatments  Labs (all labs ordered are listed, but only abnormal results are displayed) Labs Reviewed  COMPREHENSIVE METABOLIC PANEL WITH GFR - Abnormal; Notable for the following components:      Result Value   Sodium 133 (*)    CO2 20 (*)    Glucose, Bld 121 (*)    Alkaline Phosphatase 33 (*)    All other components within normal limits  CBC - Abnormal; Notable for the following components:   WBC 17.8 (*)    All other components within normal limits  URINALYSIS, ROUTINE W REFLEX MICROSCOPIC - Abnormal; Notable for the following components:    Color, Urine YELLOW (*)    APPearance HAZY (*)    Ketones, ur 80 (*)    Protein, ur 30 (*)    Bacteria, UA RARE (*)    All other components within normal limits  HCG, QUANTITATIVE, PREGNANCY - Abnormal; Notable for the following components:   hCG, Beta Chain, Quant, S [id number] (*)    All other components within normal limits  LIPASE, BLOOD     EKG  None   RADIOLOGY None   Official radiology report(s): No results found.   PROCEDURES:  Critical Care performed: No  Procedures  MEDICATIONS ORDERED IN ED: Medications  ondansetron  (ZOFRAN ) injection 4 mg (4 mg Intravenous Given 06/01/23 2048)  sodium chloride  0.9 % bolus 1,000 mL (1,000 mLs Intravenous New Bag/Given 06/01/23 2053)     IMPRESSION / MDM / ASSESSMENT AND PLAN / ED COURSE  I reviewed the triage vital signs and the nursing notes.                             23 year old female approximately [redacted] weeks pregnant presenting for nausea/vomiting. Differential diagnosis includes, but is not limited to, ovarian cyst, ovarian torsion, acute appendicitis, diverticulitis, urinary tract infection/pyelonephritis, endometriosis, bowel obstruction, colitis, renal colic, gastroenteritis, hernia, fibroids, endometriosis, pregnancy related pain including ectopic pregnancy, etc. I personally reviewed patient's records and note her ED visit at outside hospital from 05/26/2023. US  showed IUP.  Patient's presentation is most consistent with acute complicated illness / injury requiring diagnostic workup.  The patient is on the cardiac monitor to evaluate for evidence of arrhythmia and/or significant heart rate changes.  Laboratory results demonstrate leukocytosis, likely secondary to pregnancy.  Electrolytes and UA unremarkable other than ketonuria.  Positive beta hCG.  Since patient had recent ultrasound demonstrating IUP, will not repeat as patient is not experiencing pain or bleeding.  Patient is feeling significantly better after IV  fluids and Zofran .  She is tolerating liquids without emesis.  She will follow-up next week as scheduled with her OB/GYN.  Strict return precautions given.  Patient and mother verbalized understanding and agree with plan of care.      FINAL CLINICAL IMPRESSION(S) / ED DIAGNOSES   Final diagnoses:  Hyperemesis gravidarum     Rx / DC Orders   ED Discharge Orders          Ordered    ondansetron  (ZOFRAN -ODT) 4 MG disintegrating tablet  Every 8 hours PRN        06/01/23 2340             Note:  This document was prepared using Dragon voice recognition software and may include unintentional dictation errors.   Naheem Mosco J, MD 06/02/23 [phone]

## 2023-06-01 NOTE — ED Triage Notes (Signed)
 Pt is here for nausea and vomiting.  She states that she is [redacted] weeks pregnant and has had increasing nausea and vomiting.  Pt has hx of hyperemesis gravidarum in a previous pregnancy and states that she has not been able to keep anything down today.  She states that she voided last this am (before 10:30) and she has given herself a suppository for the nausea at 10:30.  Pt is having dry heaves in triage.

## 2023-06-01 NOTE — ED Notes (Signed)
 Ice chips and blanket given

## 2023-06-01 NOTE — Discharge Instructions (Addendum)
 You may add Zofran  to your rotation of nausea medicines to help with morning sickness.  Clear liquids x 12 hours, then bland diet x 1 week, then slowly advance diet as tolerated.  Return to the ER for worsening symptoms, persistent vomiting, difficulty breathing, vaginal bleeding or other concerns.

## 2023-06-01 NOTE — ED Notes (Signed)
 Urine cup given and pt advised that urine sample is needed

## 2023-06-01 NOTE — ED Notes (Signed)
Pt given ginger ale to sip on.

## 2023-06-03 ENCOUNTER — Emergency Department
Admission: EM | Admit: 2023-06-03 | Discharge: 2023-06-03 | Disposition: A | Discharge status: Home / Self Care | Attending: Emergency Medicine | Admitting: Emergency Medicine

## 2023-06-03 ENCOUNTER — Other Ambulatory Visit: Payer: Self-pay

## 2023-06-03 ENCOUNTER — Encounter: Payer: Self-pay | Admitting: Emergency Medicine

## 2023-06-03 DIAGNOSIS — Z3A01 Less than 8 weeks gestation of pregnancy: Secondary | ICD-10-CM | POA: Diagnosis not present

## 2023-06-03 DIAGNOSIS — O09291 Supervision of pregnancy with other poor reproductive or obstetric history, first trimester: Secondary | ICD-10-CM | POA: Diagnosis not present

## 2023-06-03 DIAGNOSIS — O210 Mild hyperemesis gravidarum: Secondary | ICD-10-CM | POA: Diagnosis not present

## 2023-06-03 DIAGNOSIS — O219 Vomiting of pregnancy, unspecified: Secondary | ICD-10-CM | POA: Insufficient documentation

## 2023-06-03 DIAGNOSIS — O3680X0 Pregnancy with inconclusive fetal viability, not applicable or unspecified: Secondary | ICD-10-CM | POA: Diagnosis not present

## 2023-06-03 LAB — URINALYSIS, W/ REFLEX TO CULTURE (INFECTION SUSPECTED)
Bilirubin Urine: NEGATIVE
Glucose, UA: NEGATIVE mg/dL
Hgb urine dipstick: NEGATIVE
Ketones, ur: 80 mg/dL — AB
Leukocytes,Ua: NEGATIVE
Nitrite: NEGATIVE
Protein, ur: 100 mg/dL — AB
Specific Gravity, Urine: 1.026 (ref 1.005–1.030)
pH: 6 (ref 5.0–8.0)

## 2023-06-03 LAB — COMPREHENSIVE METABOLIC PANEL WITH GFR
ALT: 23 U/L (ref 0–44)
AST: 24 U/L (ref 15–41)
Albumin: 4.4 g/dL (ref 3.5–5.0)
Alkaline Phosphatase: 31 U/L — ABNORMAL LOW (ref 38–126)
Anion gap: 10 (ref 5–15)
BUN: 10 mg/dL (ref 6–20)
CO2: 24 mmol/L (ref 22–32)
Calcium: 9.5 mg/dL (ref 8.9–10.3)
Chloride: 102 mmol/L (ref 98–111)
Creatinine, Ser: 0.62 mg/dL (ref 0.44–1.00)
GFR, Estimated: >60 mL/min (ref >60)
Glucose, Bld: 127 mg/dL — ABNORMAL HIGH (ref 70–99)
Potassium: 3.8 mmol/L (ref 3.5–5.1)
Sodium: 136 mmol/L (ref 135–145)
Total Bilirubin: 1.1 mg/dL (ref 0.0–1.2)
Total Protein: 7.2 g/dL (ref 6.5–8.1)

## 2023-06-03 LAB — CBC
HCT: 39.8 % (ref 36.0–46.0)
Hemoglobin: 13.5 g/dL (ref 12.0–15.0)
MCH: 30.3 pg (ref 26.0–34.0)
MCHC: 33.9 g/dL (ref 30.0–36.0)
MCV: 89.4 fL (ref 80.0–100.0)
Platelets: 229 10*3/uL (ref 150–400)
RBC: 4.45 MIL/uL (ref 3.87–5.11)
RDW: 11.9 % (ref 11.5–15.5)
WBC: 10.8 10*3/uL — ABNORMAL HIGH (ref 4.0–10.5)
nRBC: 0 % (ref 0.0–0.2)

## 2023-06-03 LAB — HCG, QUANTITATIVE, PREGNANCY: hCG, Beta Chain, Quant, S: >250000 m[IU]/mL — ABNORMAL HIGH (ref <5)

## 2023-06-03 LAB — LIPASE, BLOOD: Lipase: 25 U/L (ref 11–51)

## 2023-06-03 MED ORDER — LACTATED RINGERS IV BOLUS
1000.0000 mL | Freq: Once | INTRAVENOUS | Status: AC
  Administered 2023-06-03: 1000 mL via INTRAVENOUS

## 2023-06-03 MED ORDER — ONDANSETRON HCL 4 MG/2ML IJ SOLN
4.0000 mg | Freq: Once | INTRAMUSCULAR | Status: AC
  Administered 2023-06-03: 4 mg via INTRAVENOUS
  Filled 2023-06-03: qty 2

## 2023-06-03 NOTE — ED Triage Notes (Signed)
 Patient to ED via POV for N/V. Currently [redacted] weeks pregnant. States she hasn't been able to stop since 0230. Seen for same on 5/12.

## 2023-06-03 NOTE — Discharge Instructions (Signed)
 Please make sure to go to your OB/GYN today at 3 PM.  Please measure to keep your self hydrated, he can start with clear fluids, Gatorade, Pedialyte, clear soups.  Please avoid any fatty, acidic, spicy foods until you are able to eat and drink appropriately.

## 2023-06-03 NOTE — ED Provider Notes (Signed)
 [name] Provider Note    Event Date/Time   First MD Initiated Contact with Patient 06/03/23 [phone]     (approximate)   History   Emesis During Pregnancy   HPI  Alyssa Rojas is a 23 y.o. female G4, P1, [redacted] weeks pregnant presenting with nausea vomiting.  Here 2 days ago for similar symptoms.  Has history of hyperemesis gravidarum with prior pregnancies.  Typically requires IV fluids and IV antiemetics.  Has Reglan  and Phenergan  at home.  She denies any vaginal bleeding.  No gush of fluid.  No abdominal pain.  States that she tried her home antiemetics without success.  No fevers, no urinary symptoms.  States that she has appointment with OB at 3 PM, states that they were unable to see her earlier.   On independent review, she had an ultrasound that was done on 6 May that showed an intrauterine pregnancy.  Physical Exam   Triage Vital Signs: ED Triage Vitals [06/03/23 0903]  Encounter Vitals Group     BP 124/66     Systolic BP Percentile      Diastolic BP Percentile      Pulse Rate 97     Resp 17     Temp (!) 97.5 F (36.4 C)     Temp Source Oral     SpO2 100 %     Weight 95 lb (43.1 kg)     Height 5\' 1"  (1.549 m)     Head Circumference      Peak Flow      Pain Score 4     Pain Loc      Pain Education      Exclude from Growth Chart     Most recent vital signs: Vitals:   06/03/23 0903  BP: 124/66  Pulse: 97  Resp: 17  Temp: (!) 97.5 F (36.4 C)  SpO2: 100%     General: Awake, no distress.  CV:  Good peripheral perfusion.  Resp:  Normal effort.  Abd:  No distention.  Soft nontender Other:  Nontoxic-appearing   ED Results / Procedures / Treatments   Labs (all labs ordered are listed, but only abnormal results are displayed) Labs Reviewed  COMPREHENSIVE METABOLIC PANEL WITH GFR - Abnormal; Notable for the following components:      Result Value   Glucose, Bld 127 (*)    Alkaline Phosphatase 31 (*)    All other components  within normal limits  CBC - Abnormal; Notable for the following components:   WBC 10.8 (*)    All other components within normal limits  HCG, QUANTITATIVE, PREGNANCY - Abnormal; Notable for the following components:   hCG, Beta Chain, Quant, S >250,000 (*)    All other components within normal limits  URINALYSIS, W/ REFLEX TO CULTURE (INFECTION SUSPECTED) - Abnormal; Notable for the following components:   Color, Urine YELLOW (*)    APPearance HAZY (*)    Ketones, ur 80 (*)    Protein, ur 100 (*)    Bacteria, UA RARE (*)    All other components within normal limits  LIPASE, BLOOD      PROCEDURES:  Critical Care performed: No  Procedures   MEDICATIONS ORDERED IN ED: Medications  lactated ringers  bolus 1,000 mL (0 mLs Intravenous Stopped 06/03/23 1139)  ondansetron  (ZOFRAN ) injection 4 mg (4 mg Intravenous Given 06/03/23 0941)     IMPRESSION / MDM / ASSESSMENT AND PLAN / ED COURSE  I reviewed the  triage vital signs and the nursing notes.                              Differential diagnosis includes, but is not limited to, hyperemesis gravidarum, dehydration, electrolyte derangements.  Will get labs, UA, give IV fluids, IV Zofran .  Patient has a known IUP, no vaginal bleeding, no indication for repeat ultrasound at this time.  Patient's presentation is most consistent with acute presentation with potential threat to life or bodily function.  Independent interpretation of labs below.  On reassessment patient is tolerating p.o., states that she is feeling better.  Shared decision making with patient and she is agreeable plan for discharge, states that she does not need any refills to her antiemetics.  Will go to her OB/GYN appointment at 3 PM.  Considered but no indication for inpatient admission at this time, she is safe for outpatient management.  Will discharge with strict return precautions.  The patient is on the cardiac monitor to evaluate for evidence of arrhythmia and/or  significant heart rate changes.   Clinical Course as of 06/03/23 1221  Wed Jun 03, 2023  1141 Independent review of labs, mild leukocytosis, nonspecific, she has no infectious symptoms, electrolytes are severely deranged, creatinine is normal, lipase is normal, hCG is elevated. [TT]  1217 Urinalysis, w/ Reflex to Culture (Infection Suspected) -Urine, Clean Catch(!) UA not consistent with UTI. [TT]    Clinical Course User Index [TT] Drenda Gentle Richard Champion, MD     FINAL CLINICAL IMPRESSION(S) / ED DIAGNOSES   Final diagnoses:  Nausea/vomiting in pregnancy     Rx / DC Orders   ED Discharge Orders     None        Note:  This document was prepared using Dragon voice recognition software and may include unintentional dictation errors.    Shane Darling, MD 06/03/23 [phone]

## 2023-06-06 ENCOUNTER — Emergency Department
Admission: EM | Admit: 2023-06-06 | Discharge: 2023-06-06 | Disposition: A | Discharge status: Home / Self Care | Attending: Emergency Medicine | Admitting: Emergency Medicine

## 2023-06-06 ENCOUNTER — Emergency Department

## 2023-06-06 ENCOUNTER — Other Ambulatory Visit: Payer: Self-pay

## 2023-06-06 DIAGNOSIS — O219 Vomiting of pregnancy, unspecified: Secondary | ICD-10-CM | POA: Diagnosis not present

## 2023-06-06 DIAGNOSIS — O210 Mild hyperemesis gravidarum: Secondary | ICD-10-CM | POA: Diagnosis not present

## 2023-06-06 DIAGNOSIS — R109 Unspecified abdominal pain: Secondary | ICD-10-CM | POA: Diagnosis not present

## 2023-06-06 DIAGNOSIS — O26891 Other specified pregnancy related conditions, first trimester: Secondary | ICD-10-CM | POA: Diagnosis not present

## 2023-06-06 DIAGNOSIS — Z3A01 Less than 8 weeks gestation of pregnancy: Secondary | ICD-10-CM | POA: Diagnosis not present

## 2023-06-06 LAB — CBC
HCT: 39.7 % (ref 36.0–46.0)
Hemoglobin: 13.6 g/dL (ref 12.0–15.0)
MCH: 30.3 pg (ref 26.0–34.0)
MCHC: 34.3 g/dL (ref 30.0–36.0)
MCV: 88.4 fL (ref 80.0–100.0)
Platelets: 236 10*3/uL (ref 150–400)
RBC: 4.49 MIL/uL (ref 3.87–5.11)
RDW: 11.6 % (ref 11.5–15.5)
WBC: 10.5 10*3/uL (ref 4.0–10.5)
nRBC: 0 % (ref 0.0–0.2)

## 2023-06-06 LAB — HCG, QUANTITATIVE, PREGNANCY: hCG, Beta Chain, Quant, S: >250000 m[IU]/mL — ABNORMAL HIGH (ref <5)

## 2023-06-06 LAB — COMPREHENSIVE METABOLIC PANEL WITH GFR
ALT: 20 U/L (ref 0–44)
AST: 20 U/L (ref 15–41)
Albumin: 4.3 g/dL (ref 3.5–5.0)
Alkaline Phosphatase: 28 U/L — ABNORMAL LOW (ref 38–126)
Anion gap: 13 (ref 5–15)
BUN: 10 mg/dL (ref 6–20)
CO2: 21 mmol/L — ABNORMAL LOW (ref 22–32)
Calcium: 9.7 mg/dL (ref 8.9–10.3)
Chloride: 102 mmol/L (ref 98–111)
Creatinine, Ser: 0.67 mg/dL (ref 0.44–1.00)
GFR, Estimated: >60 mL/min (ref >60)
Glucose, Bld: 87 mg/dL (ref 70–99)
Potassium: 3.9 mmol/L (ref 3.5–5.1)
Sodium: 136 mmol/L (ref 135–145)
Total Bilirubin: 1.7 mg/dL — ABNORMAL HIGH (ref 0.0–1.2)
Total Protein: 7.4 g/dL (ref 6.5–8.1)

## 2023-06-06 LAB — LIPASE, BLOOD: Lipase: 26 U/L (ref 11–51)

## 2023-06-06 MED ORDER — ONDANSETRON HCL 4 MG/2ML IJ SOLN
4.0000 mg | Freq: Once | INTRAMUSCULAR | Status: AC
  Administered 2023-06-06: 4 mg via INTRAVENOUS
  Filled 2023-06-06: qty 2

## 2023-06-06 MED ORDER — LACTATED RINGERS IV BOLUS
1000.0000 mL | Freq: Once | INTRAVENOUS | Status: AC
  Administered 2023-06-06: 1000 mL via INTRAVENOUS

## 2023-06-06 NOTE — ED Provider Notes (Signed)
 [facility] Provider Note    Event Date/Time   First MD Initiated Contact with Patient 06/06/23 [phone]     (approximate)   History   Emesis and Emesis During Pregnancy   HPI  Alyssa Rojas is a 23 y.o. female with history of headaches, migraines, anxiety, hyperemesis gravidarum and as listed in EMR presents to the emergency department for treatment of persistent vomiting.  She is approximately [redacted] weeks pregnant.  She was here 2 days ago for the same.  She states that she was able to eat some crackers after discharge but then yesterday vomited pretty much all day.  She has been awake for approximately 1 hour this morning and has already vomited 3-4 times.  She is also experiencing some burning type pelvic pain on the right side.  Gravida 4 para 1.      Physical Exam   Triage Vital Signs: ED Triage Vitals  Encounter Vitals Group     BP 06/06/23 0755 113/75     Systolic BP Percentile --      Diastolic BP Percentile --      Pulse Rate 06/06/23 0755 97     Resp 06/06/23 0755 18     Temp 06/06/23 0755 97.7 F (36.5 C)     Temp Source 06/06/23 0755 Oral     SpO2 06/06/23 0755 100 %     Weight --      Height --      Head Circumference --      Peak Flow --      Pain Score 06/06/23 0754 5     Pain Loc --      Pain Education --      Exclude from Growth Chart --     Most recent vital signs: Vitals:   06/06/23 0755  BP: 113/75  Pulse: 97  Resp: 18  Temp: 97.7 F (36.5 C)  SpO2: 100%    General: Awake, no distress.  CV:  Good peripheral perfusion.  Resp:  Normal effort.  Abd:  No distention.  Other:  Pale, no diaphoresis   ED Results / Procedures / Treatments   Labs (all labs ordered are listed, but only abnormal results are displayed) Labs Reviewed  COMPREHENSIVE METABOLIC PANEL WITH GFR - Abnormal; Notable for the following components:      Result Value   CO2 21 (*)    Alkaline Phosphatase 28 (*)    Total Bilirubin 1.7 (*)    All  other components within normal limits  HCG, QUANTITATIVE, PREGNANCY - Abnormal; Notable for the following components:   hCG, Beta Chain, Quant, S >250,000 (*)    All other components within normal limits  LIPASE, BLOOD  CBC  URINALYSIS, ROUTINE W REFLEX MICROSCOPIC     EKG  Not indicated.   RADIOLOGY  Image and radiology report reviewed and interpreted by me. Radiology report consistent with the same.  Ultrasound shows single IUP 7 weeks 6 days with fetal heart rate of 167.  PROCEDURES:  Critical Care performed: No  Procedures   MEDICATIONS ORDERED IN ED:  Medications  lactated ringers  bolus 1,000 mL (0 mLs Intravenous Stopped 06/06/23 0940)  ondansetron  (ZOFRAN ) injection 4 mg (4 mg Intravenous Given 06/06/23 0851)     IMPRESSION / MDM / ASSESSMENT AND PLAN / ED COURSE   I have reviewed the triage note.  Differential diagnosis includes, but is not limited to, hyperemesis gravidarum, viral gastroenteritis, hyperglycemia  Patient's presentation is most consistent with  acute complicated illness / injury requiring diagnostic workup.  23 year old female presenting to the emergency department for persistent vomiting at approximately [redacted] weeks gestation.  She has been evaluated a couple other times for the same.  Symptoms are different today in that she has some burning pain in the right pelvic area.  She denies any vaginal discharge or bleeding.  On exam, she is very pale but skin is warm and dry.  Pain over the right lower pelvic area without focal tenderness.  Vital signs are normal.   Plan will be to give her a liter of lactated Ringer 's, obtain lab studies and urinalysis, and get a pelvic ultrasound due to new pelvic pain.  Patient is aware and agreeable to the plan.   Clinical Course as of 06/06/23 1031  Sat Jun 06, 2023  0912 Upon reassessment, patient states she is feeling better. She is less pale. IV fluids infusing. She's had about . She is sipping on Ginger  Ale. US  is pending. [CT]  1004 Patient reassessed. IV fluids are complete. She is tolerating Ginger Ale and feels hungry. Graham crackers and peanut butter given. Plan will be to discharge home if no further vomiting. [CT]  1028 Ultrasound shows single IUP 7 weeks 6 days with fetal heart rate of 167.  Lab studies are overall reassuring and without evidence of dehydration.  No vomiting after eating. Patient feels comfortable with plan of discharge. She denies need for refill of nausea medications. She was advised to return to the ER for symptoms not controlled with prescribed medication. [CT]    Clinical Course User Index [CT] Suzanna Zahn B, FNP     FINAL CLINICAL IMPRESSION(S) / ED DIAGNOSES   Final diagnoses:  Hyperemesis gravidarum     Rx / DC Orders   ED Discharge Orders     None        Note:  This document was prepared using Dragon voice recognition software and may include unintentional dictation errors.   Sherryle Don, FNP 06/06/23 1031    Iver Marker, MD 06/06/23 1525

## 2023-06-06 NOTE — ED Notes (Signed)
 PA at bedside assessing pt.

## 2023-06-06 NOTE — ED Triage Notes (Signed)
 Pt states she is [redacted] weeks pregnant and has not been able to stop vomiting. Was seen recently for the same.

## 2023-06-06 NOTE — ED Notes (Signed)
Pt provided with ginger ale per her request.  

## 2023-06-06 NOTE — ED Notes (Signed)
AVS provided to and discussed with patient and family member at bedside. Pt verbalizes understanding of discharge instructions and denies any questions or concerns at this time. Pt has ride home. Pt ambulated out of department independently with steady gait.  

## 2023-06-07 DIAGNOSIS — Z3A22 22 weeks gestation of pregnancy: Secondary | ICD-10-CM | POA: Diagnosis not present

## 2023-06-07 DIAGNOSIS — O219 Vomiting of pregnancy, unspecified: Secondary | ICD-10-CM | POA: Diagnosis not present

## 2023-06-07 DIAGNOSIS — O99282 Endocrine, nutritional and metabolic diseases complicating pregnancy, second trimester: Secondary | ICD-10-CM | POA: Diagnosis not present

## 2023-06-07 DIAGNOSIS — E860 Dehydration: Secondary | ICD-10-CM | POA: Diagnosis not present

## 2023-06-08 DIAGNOSIS — Z3480 Encounter for supervision of other normal pregnancy, unspecified trimester: Secondary | ICD-10-CM | POA: Insufficient documentation

## 2023-06-08 DIAGNOSIS — O3680X0 Pregnancy with inconclusive fetal viability, not applicable or unspecified: Secondary | ICD-10-CM | POA: Diagnosis not present

## 2023-06-08 DIAGNOSIS — O210 Mild hyperemesis gravidarum: Secondary | ICD-10-CM | POA: Diagnosis not present

## 2023-06-08 DIAGNOSIS — N912 Amenorrhea, unspecified: Secondary | ICD-10-CM | POA: Diagnosis not present

## 2023-06-09 ENCOUNTER — Other Ambulatory Visit: Payer: Self-pay | Admitting: Obstetrics

## 2023-06-09 DIAGNOSIS — O210 Mild hyperemesis gravidarum: Secondary | ICD-10-CM

## 2023-06-15 ENCOUNTER — Ambulatory Visit: Admission: RE | Admit: 2023-06-15 | Source: Ambulatory Visit

## 2023-06-15 DIAGNOSIS — Z3A09 9 weeks gestation of pregnancy: Secondary | ICD-10-CM | POA: Diagnosis not present

## 2023-06-15 DIAGNOSIS — Z3A10 10 weeks gestation of pregnancy: Secondary | ICD-10-CM | POA: Diagnosis not present

## 2023-06-15 DIAGNOSIS — O210 Mild hyperemesis gravidarum: Secondary | ICD-10-CM | POA: Diagnosis not present

## 2023-06-20 ENCOUNTER — Emergency Department
Admission: EM | Admit: 2023-06-20 | Discharge: 2023-06-20 | Disposition: A | Discharge status: Home / Self Care | Attending: Emergency Medicine | Admitting: Emergency Medicine

## 2023-06-20 ENCOUNTER — Other Ambulatory Visit: Payer: Self-pay

## 2023-06-20 DIAGNOSIS — O210 Mild hyperemesis gravidarum: Secondary | ICD-10-CM | POA: Diagnosis not present

## 2023-06-20 DIAGNOSIS — Z3A10 10 weeks gestation of pregnancy: Secondary | ICD-10-CM | POA: Insufficient documentation

## 2023-06-20 DIAGNOSIS — O219 Vomiting of pregnancy, unspecified: Secondary | ICD-10-CM | POA: Diagnosis present

## 2023-06-20 DIAGNOSIS — Z3A01 Less than 8 weeks gestation of pregnancy: Secondary | ICD-10-CM | POA: Diagnosis not present

## 2023-06-20 LAB — URINALYSIS, ROUTINE W REFLEX MICROSCOPIC
Bilirubin Urine: NEGATIVE
Glucose, UA: >=500 mg/dL — AB
Hgb urine dipstick: NEGATIVE
Ketones, ur: 80 mg/dL — AB
Leukocytes,Ua: NEGATIVE
Nitrite: NEGATIVE
Protein, ur: NEGATIVE mg/dL
Specific Gravity, Urine: 1.016 (ref 1.005–1.030)
pH: 6 (ref 5.0–8.0)

## 2023-06-20 LAB — COMPREHENSIVE METABOLIC PANEL WITH GFR
ALT: 20 U/L (ref 0–44)
AST: 27 U/L (ref 15–41)
Albumin: 4.2 g/dL (ref 3.5–5.0)
Alkaline Phosphatase: 32 U/L — ABNORMAL LOW (ref 38–126)
Anion gap: 12 (ref 5–15)
BUN: 10 mg/dL (ref 6–20)
CO2: 21 mmol/L — ABNORMAL LOW (ref 22–32)
Calcium: 9.8 mg/dL (ref 8.9–10.3)
Chloride: 101 mmol/L (ref 98–111)
Creatinine, Ser: 0.53 mg/dL (ref 0.44–1.00)
GFR, Estimated: >60 mL/min (ref >60)
Glucose, Bld: 133 mg/dL — ABNORMAL HIGH (ref 70–99)
Potassium: 4.1 mmol/L (ref 3.5–5.1)
Sodium: 134 mmol/L — ABNORMAL LOW (ref 135–145)
Total Bilirubin: 0.8 mg/dL (ref 0.0–1.2)
Total Protein: 7.6 g/dL (ref 6.5–8.1)

## 2023-06-20 LAB — CBC
HCT: 37.9 % (ref 36.0–46.0)
Hemoglobin: 13 g/dL (ref 12.0–15.0)
MCH: 30 pg (ref 26.0–34.0)
MCHC: 34.3 g/dL (ref 30.0–36.0)
MCV: 87.5 fL (ref 80.0–100.0)
Platelets: 276 K/uL (ref 150–400)
RBC: 4.33 MIL/uL (ref 3.87–5.11)
RDW: 11.6 % (ref 11.5–15.5)
WBC: 13.8 K/uL — ABNORMAL HIGH (ref 4.0–10.5)
nRBC: 0 % (ref 0.0–0.2)

## 2023-06-20 LAB — LIPASE, BLOOD: Lipase: 26 U/L (ref 11–51)

## 2023-06-20 MED ORDER — ONDANSETRON HCL 4 MG/2ML IJ SOLN
4.0000 mg | Freq: Once | INTRAMUSCULAR | Status: AC | PRN
  Administered 2023-06-20: 4 mg via INTRAVENOUS
  Filled 2023-06-20: qty 2

## 2023-06-20 MED ORDER — LACTATED RINGERS IV BOLUS
1000.0000 mL | Freq: Once | INTRAVENOUS | Status: AC
  Administered 2023-06-20: 1000 mL via INTRAVENOUS

## 2023-06-20 MED ORDER — METOCLOPRAMIDE HCL 5 MG/ML IJ SOLN
10.0000 mg | Freq: Once | INTRAMUSCULAR | Status: AC
  Administered 2023-06-20: 10 mg via INTRAVENOUS
  Filled 2023-06-20: qty 2

## 2023-06-20 MED ORDER — DEXTROSE-SODIUM CHLORIDE 5-0.9 % IV SOLN
Freq: Once | INTRAVENOUS | Status: AC
Start: 2023-06-20 — End: 2023-06-20

## 2023-06-20 MED ORDER — ONDANSETRON HCL 4 MG/2ML IJ SOLN
4.0000 mg | Freq: Once | INTRAMUSCULAR | Status: AC
  Administered 2023-06-20: 4 mg via INTRAVENOUS
  Filled 2023-06-20: qty 2

## 2023-06-20 MED ORDER — DEXTROSE-SODIUM CHLORIDE 5-0.9 % IV SOLN: Freq: Once | INTRAVENOUS | Status: AC

## 2023-06-20 MED ORDER — ONDANSETRON HCL 4 MG/2ML IJ SOLN
4.0000 mg | Freq: Once | INTRAMUSCULAR | Status: AC
Start: 2023-06-20 — End: 2023-06-20
  Administered 2023-06-20: 4 mg via INTRAVENOUS
  Filled 2023-06-20: qty 2

## 2023-06-20 NOTE — ED Provider Notes (Signed)
 [place] EMERGENCY DEPARTMENT AT [facility] Provider Note   CSN: [id number] Arrival date & time: 06/20/23  1253     History  Chief Complaint  Patient presents with   Emesis    Alyssa Rojas is a 23 y.o. female.  [redacted] weeks pregnant presents with recurrent nausea vomiting.  She woke up this morning with numerous episodes of vomiting since 2 AM.  Has not been able to keep anything down.  Has been in the emergency department several times over the last 10 weeks for similar episodes needing for IV fluids.  She has numerous amounts of nausea medication at home but has been mostly getting by with drinking ensures every 2 hours and electrolyte drinks.  She denies any abdominal pain, vaginal bleeding or urinary symptoms.  No fevers.  No chest pain or shortness of breath.  HPI     Home Medications Prior to Admission medications   Medication Sig Start Date End Date Taking? Authorizing Provider  acetaminophen  (TYLENOL ) 325 MG tablet Take 2 tablets (650 mg total) by mouth every 4 (four) hours as needed (for pain scale < 4). 12/12/20   Auston Left, CNM  benzocaine -Menthol  (DERMOPLAST) 20-0.5 % AERO Apply 1 application topically as needed for irritation (perineal discomfort). 12/12/20   Auston Left, CNM  ferrous gluconate  [facility]) 240 (27 FE) MG tablet Take 1 tablet (240 mg total) by mouth in the morning and at bedtime. 12/12/20   Auston Left, CNM  ibuprofen  (ADVIL ) 600 MG tablet Take 1 tablet (600 mg total) by mouth every 6 (six) hours. 12/12/20   Auston Left, CNM  omeprazole  (PRILOSEC  OTC) 20 MG tablet Take 1 tablet (20 mg total) by mouth daily. 09/27/21 09/27/22  Twilla Galea, MD  ondansetron  (ZOFRAN -ODT) 4 MG disintegrating tablet Take 1 tablet (4 mg total) by mouth every 8 (eight) hours as needed for nausea or vomiting. 06/01/23   Sung, Jade J, MD  Prenatal Vit-Fe Fumarate-FA (PRENATAL VITAMINS) 28-0.8 MG TABS Take 1 tablet by mouth  daily. Patient not taking: Reported on 01/21/2022 04/14/20   Loman Risk, MD  promethazine  (PHENERGAN ) 12.5 MG tablet Take 1 tablet (12.5 mg total) by mouth every 6 (six) hours as needed for nausea or vomiting. 09/27/21   Twilla Galea, MD  senna-docusate (SENOKOT-S) 8.6-50 MG tablet Take 2 tablets by mouth daily. 12/12/20   Auston Left, CNM  witch hazel-glycerin  (TUCKS) pad Apply 1 application topically as needed for hemorrhoids (for pain). 12/12/20   Auston Left, CNM  cetirizine (ZYRTEC) 10 MG tablet Take 10 mg by mouth. Patient not taking: No sig reported 11/21/13 04/14/20  [provider]  Levonorgestrel-Ethinyl Estradiol (AMETHIA,CAMRESE) 0.15-0.03 &0.01 MG tablet Take 1 tablet by mouth at bedtime. Patient not taking: No sig reported 06/25/16 04/14/20  Zenobia Hila, MD  nortriptyline (PAMELOR) 10 MG capsule 1 cap po qhs x7d, then increase to 2 caps po qhs Patient not taking: No sig reported 12/26/15 04/14/20  [provider]  omeprazole  (PRILOSEC ) 20 MG capsule Take 20 mg by mouth 2 (two) times daily.  04/14/20  [provider]  ranitidine (ZANTAC) 300 MG tablet Take 1 Tablet (300 MG Total) by Mouth ONE (1) TIME daily. 02/13/16 04/14/20  [provider]  rizatriptan (MAXALT) 10 MG tablet Take 10 mg by mouth. Patient not taking: No sig reported 04/07/16 04/14/20  [provider]      Allergies    Patient has no known allergies.    Review of  Systems   Review of Systems  Physical Exam Updated Vital Signs BP 120/65   Pulse (!) 105   Temp 98.2 F (36.8 C) (Oral)   Resp 16   LMP 04/06/2023   SpO2 98%  Physical Exam Constitutional:      Appearance: She is well-developed.  HENT:     Head: Normocephalic and atraumatic.  Eyes:     Conjunctiva/sclera: Conjunctivae normal.  Cardiovascular:     Rate and Rhythm: Normal rate.  Pulmonary:     Effort: Pulmonary effort is normal. No respiratory distress.  Abdominal:      General: There is no distension.     Tenderness: There is no abdominal tenderness. There is no right CVA tenderness or guarding.  Musculoskeletal:        General: Normal range of motion.     Cervical back: Normal range of motion.  Skin:    General: Skin is warm.     Findings: No rash.  Neurological:     Mental Status: She is alert and oriented to person, place, and time.  Psychiatric:        Behavior: Behavior normal.        Thought Content: Thought content normal.     ED Results / Procedures / Treatments   Labs (all labs ordered are listed, but only abnormal results are displayed) Labs Reviewed  COMPREHENSIVE METABOLIC PANEL WITH GFR - Abnormal; Notable for the following components:      Result Value   Sodium 134 (*)    CO2 21 (*)    Glucose, Bld 133 (*)    Alkaline Phosphatase 32 (*)    All other components within normal limits  CBC - Abnormal; Notable for the following components:   WBC 13.8 (*)    All other components within normal limits  URINALYSIS, ROUTINE W REFLEX MICROSCOPIC - Abnormal; Notable for the following components:   Color, Urine YELLOW (*)    APPearance HAZY (*)    Glucose, UA >=500 (*)    Ketones, ur 80 (*)    Bacteria, UA RARE (*)    All other components within normal limits  LIPASE, BLOOD    EKG None  Radiology No results found.  Procedures Procedures    Medications Ordered in ED Medications  ondansetron  (ZOFRAN ) injection 4 mg (4 mg Intravenous Given 06/20/23 1313)  lactated ringers  bolus 1,000 mL (0 mLs Intravenous Stopped 06/20/23 1751)  ondansetron  (ZOFRAN ) injection 4 mg (4 mg Intravenous Given 06/20/23 1432)  lactated ringers  bolus 1,000 mL (0 mLs Intravenous Stopped 06/20/23 1751)  ondansetron  (ZOFRAN ) injection 4 mg (4 mg Intravenous Given 06/20/23 1711)  metoCLOPramide  (REGLAN ) injection 10 mg (10 mg Intravenous Given 06/20/23 1743)  dextrose  5 %-0.9 % sodium chloride  infusion (0 mLs Intravenous Stopped 06/20/23 1804)  dextrose  5  %-0.9 % sodium chloride  infusion (0 mLs Intravenous Stopped 06/20/23 1904)    ED Course/ Medical Decision Making/ A&P                                 Medical Decision Making Amount and/or Complexity of Data Reviewed Labs: ordered.  Risk Prescription drug management.   23 year old female [redacted] weeks pregnant presents with hyperemesis gravidarum.  Significant dehydration with nausea and vomiting upon arrival.  She was given 2 L of LR and 1 L of D5 and normal saline.  She had a negative urinalysis.  Blood work with within normal limits.  Symptoms significant  improved after 3 L of fluids.  Patient stable and ready for discharge to home.  She understands signs symptoms return to the ED for. Final Clinical Impression(s) / ED Diagnoses Final diagnoses:  Hyperemesis gravidarum    Rx / DC Orders ED Discharge Orders     None         Orysia Blas 06/20/23 1936    Jacquie Maudlin, MD 06/21/23 2334

## 2023-06-20 NOTE — ED Notes (Signed)
 23 yof lying in a right sided recumbent position in the bed. The pt is slightly pale, warm, and dry. The pt is alert and oriented x 4. The pt advised she is having increasing nausea and vomiting. The pt is approx [redacted] weeks pregnant. Call bell at the bedside.

## 2023-06-20 NOTE — Discharge Instructions (Signed)
 Please make sure you are staying hydrated and drinking lots of fluids.  Follow-up with GYN.  Return to the ER for any worsening symptoms or any urgent changes in your health

## 2023-06-20 NOTE — ED Triage Notes (Signed)
 Pt comes in via pov with complaints of emesis in pregnancy. Pt is 10 weeks 1 day pregnant and been diagnosed with the extreme nausea. Pt has been seen multiple times for the nausea. Pt is actively vomiting in triage. Pt is alert and oriented x4 with no other signs of acute distress at this time.

## 2023-06-20 NOTE — ED Notes (Signed)
 Rn to bedside to answer call bell. Pt woke up and felt like her heart was racing. Pt is caox4, in no acute distress. Vitals updated and Md made aware.

## 2023-06-21 ENCOUNTER — Emergency Department
Admission: EM | Admit: 2023-06-21 | Discharge: 2023-06-21 | Disposition: A | Discharge status: Home / Self Care | Attending: Emergency Medicine | Admitting: Emergency Medicine

## 2023-06-21 ENCOUNTER — Other Ambulatory Visit: Payer: Self-pay

## 2023-06-21 DIAGNOSIS — O219 Vomiting of pregnancy, unspecified: Secondary | ICD-10-CM | POA: Diagnosis not present

## 2023-06-21 DIAGNOSIS — O210 Mild hyperemesis gravidarum: Secondary | ICD-10-CM | POA: Insufficient documentation

## 2023-06-21 DIAGNOSIS — Z3A10 10 weeks gestation of pregnancy: Secondary | ICD-10-CM | POA: Insufficient documentation

## 2023-06-21 LAB — CBC WITH DIFFERENTIAL/PLATELET
Abs Immature Granulocytes: 0.03 10*3/uL (ref 0.00–0.07)
Basophils Absolute: 0 10*3/uL (ref 0.0–0.1)
Basophils Relative: 0 %
Eosinophils Absolute: 0 10*3/uL (ref 0.0–0.5)
Eosinophils Relative: 0 %
HCT: 33.3 % — ABNORMAL LOW (ref 36.0–46.0)
Hemoglobin: 11.3 g/dL — ABNORMAL LOW (ref 12.0–15.0)
Immature Granulocytes: 0 %
Lymphocytes Relative: 14 %
Lymphs Abs: 1.1 10*3/uL (ref 0.7–4.0)
MCH: 30.3 pg (ref 26.0–34.0)
MCHC: 33.9 g/dL (ref 30.0–36.0)
MCV: 89.3 fL (ref 80.0–100.0)
Monocytes Absolute: 0.4 10*3/uL (ref 0.1–1.0)
Monocytes Relative: 6 %
Neutro Abs: 6.2 10*3/uL (ref 1.7–7.7)
Neutrophils Relative %: 80 %
Platelets: 214 10*3/uL (ref 150–400)
RBC: 3.73 MIL/uL — ABNORMAL LOW (ref 3.87–5.11)
RDW: 11.7 % (ref 11.5–15.5)
WBC: 7.7 10*3/uL (ref 4.0–10.5)
nRBC: 0 % (ref 0.0–0.2)

## 2023-06-21 LAB — URINALYSIS, ROUTINE W REFLEX MICROSCOPIC
Bilirubin Urine: NEGATIVE
Glucose, UA: >=500 mg/dL — AB
Hgb urine dipstick: NEGATIVE
Ketones, ur: NEGATIVE mg/dL
Leukocytes,Ua: NEGATIVE
Nitrite: NEGATIVE
Protein, ur: NEGATIVE mg/dL
Specific Gravity, Urine: 1.011 (ref 1.005–1.030)
pH: 8 (ref 5.0–8.0)

## 2023-06-21 LAB — BASIC METABOLIC PANEL WITH GFR
Anion gap: 7 (ref 5–15)
BUN: 6 mg/dL (ref 6–20)
CO2: 27 mmol/L (ref 22–32)
Calcium: 8.9 mg/dL (ref 8.9–10.3)
Chloride: 104 mmol/L (ref 98–111)
Creatinine, Ser: 0.44 mg/dL (ref 0.44–1.00)
GFR, Estimated: >60 mL/min (ref >60)
Glucose, Bld: 109 mg/dL — ABNORMAL HIGH (ref 70–99)
Potassium: 3.5 mmol/L (ref 3.5–5.1)
Sodium: 138 mmol/L (ref 135–145)

## 2023-06-21 MED ORDER — DEXTROSE IN LACTATED RINGERS 5 % IV SOLN
1000.0000 mL | Freq: Once | INTRAVENOUS | Status: AC
  Administered 2023-06-21: 1000 mL via INTRAVENOUS

## 2023-06-21 MED ORDER — ONDANSETRON HCL 4 MG/2ML IJ SOLN
4.0000 mg | Freq: Once | INTRAMUSCULAR | Status: AC
  Administered 2023-06-21: 4 mg via INTRAVENOUS
  Filled 2023-06-21: qty 2

## 2023-06-21 NOTE — ED Provider Notes (Signed)
 [facility] Provider Note    Event Date/Time   First MD Initiated Contact with Patient 06/21/23 1107     (approximate)   History   Emesis   HPI Alyssa Rojas is a 23 y.o. female who is [redacted] weeks pregnant presenting today for nausea with vomiting.  Patient has had recurrent symptoms throughout her pregnancy of nausea and vomiting.  She has been seen multiple times throughout the last 10 weeks for similar symptoms requiring IV fluids.  She denies any new symptoms since being seen in the ED yesterday where she had complete resolution following fluids and Zofran .  She has been trying to drink ensures at home.  She tried an oral Zofran  this morning but threw it up before she can keep it down.  Denies any abdominal pain, vaginal bleeding, vaginal discharge, dysuria.  She denies any alcohol or tobacco use during pregnancy.  Does admit to smoking marijuana with last occurrence 2 days ago.  Chart review: Reviewed patient's ER note from yesterday as well as multiple other visits recently.     Physical Exam   Triage Vital Signs: ED Triage Vitals  Encounter Vitals Group     BP 06/21/23 1051 122/82     Systolic BP Percentile --      Diastolic BP Percentile --      Pulse Rate 06/21/23 1051 98     Resp 06/21/23 1051 16     Temp 06/21/23 1051 97.9 F (36.6 C)     Temp Source 06/21/23 1051 Oral     SpO2 06/21/23 1051 98 %     Weight 06/21/23 1050 95 lb (43.1 kg)     Height 06/21/23 1050 5\' 1"  (1.549 m)     Head Circumference --      Peak Flow --      Pain Score 06/21/23 1050 0     Pain Loc --      Pain Education --      Exclude from Growth Chart --     Most recent vital signs: Vitals:   06/21/23 1051  BP: 122/82  Pulse: 98  Resp: 16  Temp: 97.9 F (36.6 C)  SpO2: 98%   I have reviewed the vital signs. General:  Awake, alert, no acute distress. Head:  Normocephalic, Atraumatic. EENT:  PERRL, EOMI, Oral mucosa pink and moist, Neck is  supple. Cardiovascular: Regular rate, 2+ distal pulses. Respiratory:  Normal respiratory effort, symmetrical expansion, no distress.   Abdomen: Soft, nontender, nondistended Extremities:  Moving all four extremities through full ROM without pain.   Neuro:  Alert and oriented.  Interacting appropriately.   Skin:  Warm, dry, no rash.   Psych: Appropriate affect.    ED Results / Procedures / Treatments   Labs (all labs ordered are listed, but only abnormal results are displayed) Labs Reviewed  CBC WITH DIFFERENTIAL/PLATELET - Abnormal; Notable for the following components:      Result Value   RBC 3.73 (*)    Hemoglobin 11.3 (*)    HCT 33.3 (*)    All other components within normal limits  BASIC METABOLIC PANEL WITH GFR - Abnormal; Notable for the following components:   Glucose, Bld 109 (*)    All other components within normal limits  URINALYSIS, ROUTINE W REFLEX MICROSCOPIC - Abnormal; Notable for the following components:   Color, Urine COLORLESS (*)    APPearance CLOUDY (*)    Glucose, UA >=500 (*)    Bacteria, UA RARE (*)  All other components within normal limits     EKG    RADIOLOGY    PROCEDURES:  Critical Care performed: No  Procedures   MEDICATIONS ORDERED IN ED: Medications  dextrose  5 % in lactated ringers  infusion (1,000 mLs Intravenous Bolus 06/21/23 1144)  ondansetron  (ZOFRAN ) injection 4 mg (4 mg Intravenous Given 06/21/23 1145)     IMPRESSION / MDM / ASSESSMENT AND PLAN / ED COURSE  I reviewed the triage vital signs and the nursing notes.                              Differential diagnosis includes, but is not limited to, hyperemesis gravidarum  Patient's presentation is most consistent with acute complicated illness / injury requiring diagnostic workup.  Patient is a 23 year old female presenting today for recurrent episodes of nausea and vomiting in the setting of pregnancy consistent with hyperemesis gravidarum.  Reviewed prior ED notes.   Her vital signs are stable on arrival and physical exam unremarkable with no abdominal pain or other concerning features in pregnancy at this time.  Will obtain laboratory workup and give 1 L of D5LR and Zofran  initially.  Laboratory workup reassuring.  UA with no signs of infection.  Patient was able to tolerate p.o. without issue following medications and fluids.  She was feeling much better and wanted to go home at this time.  Did discuss decreased use of marijuana as this may be contributing to her vomiting symptoms. Told to follow-up with OB team and given strict return precautions.  Clinical Course as of 06/21/23 1353  Sun Jun 21, 2023  1339 Patient feeling better and tolerating p.o.  He wishes to go home at this time. [DW]    Clinical Course User Index [DW] Kandee Orion, MD     FINAL CLINICAL IMPRESSION(S) / ED DIAGNOSES   Final diagnoses:  Hyperemesis gravidarum     Rx / DC Orders   ED Discharge Orders     None        Note:  This document was prepared using Dragon voice recognition software and may include unintentional dictation errors.   Kandee Orion, MD 06/21/23 [phone]

## 2023-06-21 NOTE — Discharge Instructions (Signed)
 Please follow-up with your OB team for ongoing outpatient treatment.

## 2023-06-21 NOTE — ED Notes (Signed)
 Pt given water for fluid challenge per Dr. Karlynn Oyster.

## 2023-06-21 NOTE — ED Triage Notes (Signed)
 Pt comes with vomiting. Pt is [redacted] wks pregnant.  Pt was just seen here yesterday for same.

## 2023-06-21 NOTE — ED Notes (Signed)
 PT is CAOx4, breathing normally, and normal in color. Pt states that she is [redacted] weeks pregnant and has been nauseous with vomiting her whole pregnancy. Pt states that she was just seen here yesterday for the same thing, but as soon as she got home, she was vomiting again. Pt states she can't keep anything down and is afraid of becoming dehydrated. Pt's significant other and child are with her at bedside at this time.

## 2023-06-21 NOTE — ED Notes (Signed)
 Pt states that she is feeling much better and was able to handle the water fluid challenge without issue.

## 2023-06-26 ENCOUNTER — Other Ambulatory Visit: Payer: Self-pay

## 2023-06-26 ENCOUNTER — Emergency Department
Admission: EM | Admit: 2023-06-26 | Discharge: 2023-06-26 | Disposition: A | Discharge status: Home / Self Care | Attending: Emergency Medicine | Admitting: Emergency Medicine

## 2023-06-26 DIAGNOSIS — O219 Vomiting of pregnancy, unspecified: Secondary | ICD-10-CM | POA: Insufficient documentation

## 2023-06-26 DIAGNOSIS — Z3A11 11 weeks gestation of pregnancy: Secondary | ICD-10-CM | POA: Diagnosis not present

## 2023-06-26 DIAGNOSIS — R8271 Bacteriuria: Secondary | ICD-10-CM | POA: Insufficient documentation

## 2023-06-26 DIAGNOSIS — Z3A01 Less than 8 weeks gestation of pregnancy: Secondary | ICD-10-CM | POA: Diagnosis not present

## 2023-06-26 LAB — COMPREHENSIVE METABOLIC PANEL WITH GFR
ALT: 14 U/L (ref 0–44)
AST: 23 U/L (ref 15–41)
Albumin: 4 g/dL (ref 3.5–5.0)
Alkaline Phosphatase: 34 U/L — ABNORMAL LOW (ref 38–126)
Anion gap: 11 (ref 5–15)
BUN: 10 mg/dL (ref 6–20)
CO2: 19 mmol/L — ABNORMAL LOW (ref 22–32)
Calcium: 9.4 mg/dL (ref 8.9–10.3)
Chloride: 105 mmol/L (ref 98–111)
Creatinine, Ser: 0.53 mg/dL (ref 0.44–1.00)
GFR, Estimated: >60 mL/min (ref >60)
Glucose, Bld: 133 mg/dL — ABNORMAL HIGH (ref 70–99)
Potassium: 3.9 mmol/L (ref 3.5–5.1)
Sodium: 135 mmol/L (ref 135–145)
Total Bilirubin: 0.8 mg/dL (ref 0.0–1.2)
Total Protein: 7.1 g/dL (ref 6.5–8.1)

## 2023-06-26 LAB — URINALYSIS, ROUTINE W REFLEX MICROSCOPIC
Bilirubin Urine: NEGATIVE
Glucose, UA: NEGATIVE mg/dL
Hgb urine dipstick: NEGATIVE
Ketones, ur: 5 mg/dL — AB
Nitrite: NEGATIVE
Protein, ur: 100 mg/dL — AB
Specific Gravity, Urine: 1.028 (ref 1.005–1.030)
pH: 5 (ref 5.0–8.0)

## 2023-06-26 LAB — CBC
HCT: 41.4 % (ref 36.0–46.0)
Hemoglobin: 14 g/dL (ref 12.0–15.0)
MCH: 29.7 pg (ref 26.0–34.0)
MCHC: 33.8 g/dL (ref 30.0–36.0)
MCV: 87.9 fL (ref 80.0–100.0)
Platelets: 300 10*3/uL (ref 150–400)
RBC: 4.71 MIL/uL (ref 3.87–5.11)
RDW: 11.8 % (ref 11.5–15.5)
WBC: 17.9 10*3/uL — ABNORMAL HIGH (ref 4.0–10.5)
nRBC: 0 % (ref 0.0–0.2)

## 2023-06-26 LAB — LIPASE, BLOOD: Lipase: 28 U/L (ref 11–51)

## 2023-06-26 LAB — HCG, QUANTITATIVE, PREGNANCY: hCG, Beta Chain, Quant, S: >250000 m[IU]/mL — ABNORMAL HIGH (ref <5)

## 2023-06-26 MED ORDER — SODIUM CHLORIDE 0.9 % IV BOLUS
1000.0000 mL | Freq: Once | INTRAVENOUS | Status: AC
  Administered 2023-06-26: 1000 mL via INTRAVENOUS

## 2023-06-26 MED ORDER — ONDANSETRON 4 MG PO TBDP: 4.0000 mg | ORAL_TABLET | Freq: Three times a day (TID) | ORAL | 0 refills | Status: DC | PRN

## 2023-06-26 MED ORDER — ONDANSETRON 4 MG PO TBDP
4.0000 mg | ORAL_TABLET | Freq: Once | ORAL | Status: AC
  Administered 2023-06-26: 4 mg via ORAL
  Filled 2023-06-26: qty 1

## 2023-06-26 NOTE — ED Notes (Signed)
 PO challenge complete, the pt is able to keep the ginger ale down.

## 2023-06-26 NOTE — ED Provider Notes (Signed)
 [facility] Provider Note    Event Date/Time   First MD Initiated Contact with Patient 06/26/23 [phone]     (approximate)   History   Emesis During Pregnancy   HPI  Alyssa Rojas is a 23 y.o. female   presents to the ED with complaint of vomiting.  Patient is currently [redacted] weeks pregnant and states that she ran out of Zofran  at home and has been able to keep fluids down.  Patient has been seen at [facility] and states that they will not refill her medication.  She has made an appointment with them.  She has tried various other medications without relief of her nausea and states that Zofran  has been the only thing that helps.      Physical Exam   Triage Vital Signs: ED Triage Vitals  Encounter Vitals Group     BP 06/26/23 0605 (!) 138/99     Systolic BP Percentile --      Diastolic BP Percentile --      Pulse Rate 06/26/23 0605 (!) 111     Resp 06/26/23 0605 20     Temp 06/26/23 0605 97.9 F (36.6 C)     Temp Source 06/26/23 0605 Oral     SpO2 06/26/23 0605 98 %     Weight --      Height --      Head Circumference --      Peak Flow --      Pain Score 06/26/23 0606 0     Pain Loc --      Pain Education --      Exclude from Growth Chart --     Most recent vital signs: Vitals:   06/26/23 0726 06/26/23 1027  BP:  128/88  Pulse:  86  Resp:  18  Temp:  98 F (36.7 C)  SpO2: 98% 98%     General: Awake, no distress.  CV:  Good peripheral perfusion.  Heart regular rate rhythm. Resp:  Normal effort.  Lungs clear bilaterally. Abd:  No distention.  Other:     ED Results / Procedures / Treatments   Labs (all labs ordered are listed, but only abnormal results are displayed) Labs Reviewed  COMPREHENSIVE METABOLIC PANEL WITH GFR - Abnormal; Notable for the following components:      Result Value   CO2 19 (*)    Glucose, Bld 133 (*)    Alkaline Phosphatase 34 (*)    All other components within normal limits  CBC - Abnormal;  Notable for the following components:   WBC 17.9 (*)    All other components within normal limits  URINALYSIS, ROUTINE W REFLEX MICROSCOPIC - Abnormal; Notable for the following components:   APPearance CLOUDY (*)    Ketones, ur 5 (*)    Protein, ur 100 (*)    Leukocytes,Ua TRACE (*)    Bacteria, UA FEW (*)    All other components within normal limits  HCG, QUANTITATIVE, PREGNANCY - Abnormal; Notable for the following components:   hCG, Beta Chain, Quant, S >250,000 (*)    All other components within normal limits  URINE CULTURE  LIPASE, BLOOD       PROCEDURES:  Critical Care performed:   Procedures   MEDICATIONS ORDERED IN ED: Medications  ondansetron  (ZOFRAN -ODT) disintegrating tablet 4 mg (4 mg Oral Given 06/26/23 0616)  sodium chloride  0.9 % bolus 1,000 mL (0 mLs Intravenous Stopped 06/26/23 0720)     IMPRESSION /  MDM / ASSESSMENT AND PLAN / ED COURSE  I reviewed the triage vital signs and the nursing notes.   Differential diagnosis includes, but is not limited to, emesis with pregnancy, urinary tract infection, viral illness  23 year old female presents to the ED with complaint of nausea and vomiting as she is [redacted] weeks pregnant.  Patient is being seen by Dr. Alvia Awkward and states that she ran out of her Zofran .  This medication has been the only thing that has kept her from vomiting.  Patient was given Zofran  and a liter of fluids prior to examination and had 1 small bout of nausea.  Prior to discharge she was tolerating fluids well.  Lab work was reassuring.  There was trace leukocytes with few bacteria and a urine culture was ordered since she is pregnant.  Patient is strongly encouraged to call and make an appoint with Dr. Alvia Awkward.  A prescription for Zofran  was sent to her pharmacy to continue taking and she is encouraged to continue with clear liquids the remainder of today.      Patient's presentation is most consistent with acute complicated illness / injury  requiring diagnostic workup.  FINAL CLINICAL IMPRESSION(S) / ED DIAGNOSES   Final diagnoses:  Nausea and vomiting during pregnancy     Rx / DC Orders   ED Discharge Orders          Ordered    ondansetron  (ZOFRAN -ODT) 4 MG disintegrating tablet  Every 8 hours PRN        06/26/23 1016             Note:  This document was prepared using Dragon voice recognition software and may include unintentional dictation errors.   Stafford Eagles, PA-C 06/26/23 1436    Bryson Carbine, MD 06/26/23 1534

## 2023-06-26 NOTE — ED Triage Notes (Signed)
 Patient C/O vomiting. She is [redacted] weeks pregnant, and states that she ran out of zofran  at home.

## 2023-06-26 NOTE — Discharge Instructions (Signed)
 Call and make an appointment to follow up with your OB/GYN Zofran  was sent to your pharmacy.  Increase fluids today.  Clear liquids.  Avoid fatty foods and milk products today to allow your stomach to settle down.

## 2023-06-27 LAB — URINE CULTURE
Culture: NO GROWTH
Special Requests: NORMAL

## 2023-06-29 DIAGNOSIS — O210 Mild hyperemesis gravidarum: Secondary | ICD-10-CM | POA: Diagnosis not present

## 2023-06-29 DIAGNOSIS — Z3480 Encounter for supervision of other normal pregnancy, unspecified trimester: Secondary | ICD-10-CM | POA: Diagnosis not present

## 2023-06-29 LAB — OB RESULTS CONSOLE RUBELLA ANTIBODY, IGM: Rubella: NON-IMMUNE/NOT IMMUNE

## 2023-06-29 LAB — OB RESULTS CONSOLE VARICELLA ZOSTER ANTIBODY, IGG: Varicella: IMMUNE

## 2023-06-29 LAB — OB RESULTS CONSOLE HEPATITIS B SURFACE ANTIGEN: Hepatitis B Surface Ag: NEGATIVE

## 2023-07-06 ENCOUNTER — Other Ambulatory Visit: Payer: Self-pay

## 2023-07-06 DIAGNOSIS — Z3A12 12 weeks gestation of pregnancy: Secondary | ICD-10-CM | POA: Diagnosis not present

## 2023-07-06 DIAGNOSIS — Z5321 Procedure and treatment not carried out due to patient leaving prior to being seen by health care provider: Secondary | ICD-10-CM | POA: Insufficient documentation

## 2023-07-06 DIAGNOSIS — O210 Mild hyperemesis gravidarum: Secondary | ICD-10-CM | POA: Diagnosis not present

## 2023-07-06 DIAGNOSIS — Z3A01 Less than 8 weeks gestation of pregnancy: Secondary | ICD-10-CM | POA: Diagnosis not present

## 2023-07-06 DIAGNOSIS — D72829 Elevated white blood cell count, unspecified: Secondary | ICD-10-CM | POA: Diagnosis not present

## 2023-07-06 DIAGNOSIS — O219 Vomiting of pregnancy, unspecified: Secondary | ICD-10-CM | POA: Insufficient documentation

## 2023-07-06 LAB — COMPREHENSIVE METABOLIC PANEL WITH GFR
ALT: 27 U/L (ref 0–44)
AST: 27 U/L (ref 15–41)
Albumin: 3.6 g/dL (ref 3.5–5.0)
Alkaline Phosphatase: 39 U/L (ref 38–126)
Anion gap: 15 (ref 5–15)
BUN: 9 mg/dL (ref 6–20)
CO2: 18 mmol/L — ABNORMAL LOW (ref 22–32)
Calcium: 9.6 mg/dL (ref 8.9–10.3)
Chloride: 103 mmol/L (ref 98–111)
Creatinine, Ser: 0.38 mg/dL — ABNORMAL LOW (ref 0.44–1.00)
GFR, Estimated: >60 mL/min (ref >60)
Glucose, Bld: 134 mg/dL — ABNORMAL HIGH (ref 70–99)
Potassium: 3.5 mmol/L (ref 3.5–5.1)
Sodium: 136 mmol/L (ref 135–145)
Total Bilirubin: 0.8 mg/dL (ref 0.0–1.2)
Total Protein: 7 g/dL (ref 6.5–8.1)

## 2023-07-06 LAB — LIPASE, BLOOD: Lipase: 24 U/L (ref 11–51)

## 2023-07-06 LAB — CBC
HCT: 39.1 % (ref 36.0–46.0)
Hemoglobin: 13.1 g/dL (ref 12.0–15.0)
MCH: 29.2 pg (ref 26.0–34.0)
MCHC: 33.5 g/dL (ref 30.0–36.0)
MCV: 87.3 fL (ref 80.0–100.0)
Platelets: 287 10*3/uL (ref 150–400)
RBC: 4.48 MIL/uL (ref 3.87–5.11)
RDW: 11.7 % (ref 11.5–15.5)
WBC: 18.3 10*3/uL — ABNORMAL HIGH (ref 4.0–10.5)
nRBC: 0 % (ref 0.0–0.2)

## 2023-07-06 LAB — HCG, QUANTITATIVE, PREGNANCY: hCG, Beta Chain, Quant, S: 209576 m[IU]/mL — ABNORMAL HIGH (ref <5)

## 2023-07-06 MED ORDER — ONDANSETRON 4 MG PO TBDP
4.0000 mg | ORAL_TABLET | Freq: Once | ORAL | Status: AC
  Administered 2023-07-06: 4 mg via ORAL
  Filled 2023-07-06: qty 1

## 2023-07-06 NOTE — ED Triage Notes (Signed)
 Pt to ED via c/o vomiting. Pt states she is [redacted] weeks pregnant and has been throwing up all  day. Usually takes zofran  but ran out. Denies CP, SOB, fevers, dizziness

## 2023-07-07 ENCOUNTER — Other Ambulatory Visit: Payer: Self-pay

## 2023-07-07 ENCOUNTER — Emergency Department
Admission: EM | Admit: 2023-07-07 | Discharge: 2023-07-07 | Disposition: A | Discharge status: Home / Self Care | Source: Home / Self Care | Attending: Emergency Medicine | Admitting: Emergency Medicine

## 2023-07-07 ENCOUNTER — Emergency Department
Admission: EM | Admit: 2023-07-07 | Discharge: 2023-07-07 | Discharge status: Against Medical Advice | Attending: Emergency Medicine | Admitting: Emergency Medicine

## 2023-07-07 DIAGNOSIS — O210 Mild hyperemesis gravidarum: Secondary | ICD-10-CM | POA: Insufficient documentation

## 2023-07-07 DIAGNOSIS — Z3A12 12 weeks gestation of pregnancy: Secondary | ICD-10-CM | POA: Insufficient documentation

## 2023-07-07 DIAGNOSIS — D72829 Elevated white blood cell count, unspecified: Secondary | ICD-10-CM | POA: Insufficient documentation

## 2023-07-07 DIAGNOSIS — Z3A01 Less than 8 weeks gestation of pregnancy: Secondary | ICD-10-CM | POA: Diagnosis not present

## 2023-07-07 LAB — COMPREHENSIVE METABOLIC PANEL WITH GFR
ALT: 28 U/L (ref 0–44)
AST: 26 U/L (ref 15–41)
Albumin: 3.5 g/dL (ref 3.5–5.0)
Alkaline Phosphatase: 41 U/L (ref 38–126)
Anion gap: 12 (ref 5–15)
BUN: 13 mg/dL (ref 6–20)
CO2: 23 mmol/L (ref 22–32)
Calcium: 9.4 mg/dL (ref 8.9–10.3)
Chloride: 100 mmol/L (ref 98–111)
Creatinine, Ser: 0.46 mg/dL (ref 0.44–1.00)
GFR, Estimated: >60 mL/min (ref >60)
Glucose, Bld: 108 mg/dL — ABNORMAL HIGH (ref 70–99)
Potassium: 3.5 mmol/L (ref 3.5–5.1)
Sodium: 135 mmol/L (ref 135–145)
Total Bilirubin: 0.9 mg/dL (ref 0.0–1.2)
Total Protein: 6.8 g/dL (ref 6.5–8.1)

## 2023-07-07 LAB — CBC
HCT: 37.6 % (ref 36.0–46.0)
Hemoglobin: 12.7 g/dL (ref 12.0–15.0)
MCH: 29.5 pg (ref 26.0–34.0)
MCHC: 33.8 g/dL (ref 30.0–36.0)
MCV: 87.4 fL (ref 80.0–100.0)
Platelets: 268 10*3/uL (ref 150–400)
RBC: 4.3 MIL/uL (ref 3.87–5.11)
RDW: 11.8 % (ref 11.5–15.5)
WBC: 14.1 10*3/uL — ABNORMAL HIGH (ref 4.0–10.5)
nRBC: 0 % (ref 0.0–0.2)

## 2023-07-07 LAB — LIPASE, BLOOD: Lipase: 25 U/L (ref 11–51)

## 2023-07-07 MED ORDER — FAMOTIDINE IN NACL 20-0.9 MG/50ML-% IV SOLN
20.0000 mg | Freq: Once | INTRAVENOUS | Status: AC
  Administered 2023-07-07: 20 mg via INTRAVENOUS
  Filled 2023-07-07: qty 50

## 2023-07-07 MED ORDER — LACTATED RINGERS IV BOLUS
1000.0000 mL | Freq: Once | INTRAVENOUS | Status: AC
  Administered 2023-07-07: 1000 mL via INTRAVENOUS

## 2023-07-07 MED ORDER — ONDANSETRON HCL 4 MG/2ML IJ SOLN
4.0000 mg | Freq: Once | INTRAMUSCULAR | Status: AC
  Administered 2023-07-07: 4 mg via INTRAVENOUS
  Filled 2023-07-07: qty 2

## 2023-07-07 MED ORDER — ONDANSETRON 4 MG PO TBDP: 4.0000 mg | ORAL_TABLET | Freq: Three times a day (TID) | ORAL | 2 refills | Status: DC | PRN

## 2023-07-07 MED ORDER — ONDANSETRON 4 MG PO TBDP
4.0000 mg | ORAL_TABLET | Freq: Once | ORAL | Status: DC
  Filled 2023-07-07: qty 1

## 2023-07-07 NOTE — ED Notes (Signed)
 No answer when called several times from lobby

## 2023-07-07 NOTE — ED Provider Notes (Signed)
 [facility] Emergency Department Provider Note     Event Date/Time   First MD Initiated Contact with Patient 07/07/23 2030     (approximate)   History   Emesis During Pregnancy   HPI  Alyssa Rojas is a 23 y.o. female G2P1 at [redacted] weeks gestation, presents to the ED for chronic hyperemesis gravidarum.  Patient reports similar symptoms with her last pregnancy for the duration of that gestation.  Similarly, she is endorsed near every day nausea with and without vomiting with this particular pregnancy.  She has taken Reglan  with limited benefit and is noted better relief with Zofran .  Chart review reveals she had a prescription for Diclegis  written 4 weeks ago at [facility].  Patient apparently did not fill her prescription either due to insurance coverage as her cost.  She presents to the ED today accompanied by her mom, for evaluation of ongoing nausea vomiting.  Denies any fevers, chills, or sweats.  No dysuria or hematuria reported.  No vaginal bleeding or vaginal discharge.  Denies any pregnancy related complaints including abdominal or pelvic discomfort.  Physical Exam   Triage Vital Signs: ED Triage Vitals  Encounter Vitals Group     BP 07/07/23 1959 124/80     Girls Systolic BP Percentile --      Girls Diastolic BP Percentile --      Boys Systolic BP Percentile --      Boys Diastolic BP Percentile --      Pulse Rate 07/07/23 1959 100     Resp 07/07/23 1959 18     Temp 07/07/23 1959 98.3 F (36.8 C)     Temp src --      SpO2 07/07/23 1959 100 %     Weight 07/07/23 1959 94 lb (42.6 kg)     Height 07/07/23 1959 5' 1 (1.549 m)     Head Circumference --      Peak Flow --      Pain Score 07/07/23 1958 0     Pain Loc --      Pain Education --      Exclude from Growth Chart --     Most recent vital signs: Vitals:   07/07/23 1959  BP: 124/80  Pulse: 100  Resp: 18  Temp: 98.3 F (36.8 C)  SpO2: 100%    General Awake, no distress.  NAD HEENT NCAT. PERRL. EOMI. No rhinorrhea. Mucous membranes are moist.  CV:  Good peripheral perfusion. RRR RESP:  Normal effort. CTA ABD:  No distention.  GYN:  Deferred    ED Results / Procedures / Treatments   Labs (all labs ordered are listed, but only abnormal results are displayed) Labs Reviewed  COMPREHENSIVE METABOLIC PANEL WITH GFR - Abnormal; Notable for the following components:      Result Value   Glucose, Bld 108 (*)    All other components within normal limits  CBC - Abnormal; Notable for the following components:   WBC 14.1 (*)    All other components within normal limits  LIPASE, BLOOD  URINALYSIS, ROUTINE W REFLEX MICROSCOPIC    EKG   RADIOLOGY  No results found.   PROCEDURES:  Critical Care performed: No  Procedures   MEDICATIONS ORDERED IN ED: Medications  ondansetron  (ZOFRAN ) injection 4 mg (has no administration in time range)  lactated ringers  bolus 1,000 mL (0 mLs Intravenous Stopped 07/07/23 2209)  famotidine  (PEPCID ) IVPB 20 mg premix (0 mg Intravenous Stopped 07/07/23 2132)  IMPRESSION / MDM / ASSESSMENT AND PLAN / ED COURSE  I reviewed the triage vital signs and the nursing notes.                              Differential diagnosis includes, but is not limited to, hyperemesis gravidarum, electrolyte abnormality, gastritis, UTI, dehydration  Patient's presentation is most consistent with acute complicated illness / injury requiring diagnostic workup.  Patient's diagnosis is consistent with hyperemesis gravidarum.  Gravid patient at [redacted] weeks gestation, presenting with chronic symptoms by report.  Patient stable at this time reassuring exam and workup.  Vital signs are stable and labs reviewed by me, revealed no acute abnormalities.  No critical anemia, leukocytosis, or evidence of dehydration.  Treated with a fluid bolus of LR as well as IV famotidine  and Zofran .  She has had no  vomiting throughout her course in the ED.  Patient will  be discharged home with prescriptions for Zofran . Patient is to follow up with her OB as planned, as needed or otherwise directed. Patient is given ED precautions to return to the ED for any worsening or new symptoms.   FINAL CLINICAL IMPRESSION(S) / ED DIAGNOSES   Final diagnoses:  Hyperemesis gravidarum     Rx / DC Orders   ED Discharge Orders          Ordered    ondansetron  (ZOFRAN -ODT) 4 MG disintegrating tablet  Every 8 hours PRN        07/07/23 2100             Note:  This document was prepared using Dragon voice recognition software and may include unintentional dictation errors.    May Sparks, PA-C 07/07/23 2237    Twilla Galea, MD 07/07/23 2249

## 2023-07-07 NOTE — ED Triage Notes (Signed)
 Pt reports she has had n/v since this past Sunday pt is aprox [redacted] weeks pregnant. Pt is seen at [facility] for OB care. Pt denies abd pain or vaginal bleeding.

## 2023-07-07 NOTE — Discharge Instructions (Addendum)
 Take the prescription meds as directed.  Continue to hydrate and eat small carb-rich portions to help with symptoms.  Follow-up with your [facility] provider as scheduled.  Return to ED if needed.

## 2023-07-15 ENCOUNTER — Emergency Department
Admission: EM | Admit: 2023-07-15 | Discharge: 2023-07-15 | Disposition: A | Discharge status: Home / Self Care | Attending: Emergency Medicine | Admitting: Emergency Medicine

## 2023-07-15 ENCOUNTER — Other Ambulatory Visit: Payer: Self-pay

## 2023-07-15 DIAGNOSIS — O219 Vomiting of pregnancy, unspecified: Secondary | ICD-10-CM | POA: Diagnosis not present

## 2023-07-15 DIAGNOSIS — Z3A13 13 weeks gestation of pregnancy: Secondary | ICD-10-CM | POA: Diagnosis not present

## 2023-07-15 DIAGNOSIS — D72829 Elevated white blood cell count, unspecified: Secondary | ICD-10-CM | POA: Insufficient documentation

## 2023-07-15 DIAGNOSIS — Z3A01 Less than 8 weeks gestation of pregnancy: Secondary | ICD-10-CM | POA: Diagnosis not present

## 2023-07-15 DIAGNOSIS — O210 Mild hyperemesis gravidarum: Secondary | ICD-10-CM | POA: Diagnosis not present

## 2023-07-15 LAB — CBC
HCT: 37.9 % (ref 36.0–46.0)
Hemoglobin: 12.9 g/dL (ref 12.0–15.0)
MCH: 29.5 pg (ref 26.0–34.0)
MCHC: 34 g/dL (ref 30.0–36.0)
MCV: 86.7 fL (ref 80.0–100.0)
Platelets: 238 10*3/uL (ref 150–400)
RBC: 4.37 MIL/uL (ref 3.87–5.11)
RDW: 11.9 % (ref 11.5–15.5)
WBC: 13.3 10*3/uL — ABNORMAL HIGH (ref 4.0–10.5)
nRBC: 0 % (ref 0.0–0.2)

## 2023-07-15 LAB — COMPREHENSIVE METABOLIC PANEL WITH GFR
ALT: 20 U/L (ref 0–44)
AST: 27 U/L (ref 15–41)
Albumin: 3.5 g/dL (ref 3.5–5.0)
Alkaline Phosphatase: 41 U/L (ref 38–126)
Anion gap: 11 (ref 5–15)
BUN: 8 mg/dL (ref 6–20)
CO2: 20 mmol/L — ABNORMAL LOW (ref 22–32)
Calcium: 9.5 mg/dL (ref 8.9–10.3)
Chloride: 104 mmol/L (ref 98–111)
Creatinine, Ser: 0.44 mg/dL (ref 0.44–1.00)
GFR, Estimated: >60 mL/min (ref >60)
Glucose, Bld: 114 mg/dL — ABNORMAL HIGH (ref 70–99)
Potassium: 4.3 mmol/L (ref 3.5–5.1)
Sodium: 135 mmol/L (ref 135–145)
Total Bilirubin: 0.7 mg/dL (ref 0.0–1.2)
Total Protein: 7.1 g/dL (ref 6.5–8.1)

## 2023-07-15 LAB — LIPASE, BLOOD: Lipase: 25 U/L (ref 11–51)

## 2023-07-15 MED ORDER — SODIUM CHLORIDE 0.9 % IV BOLUS
1000.0000 mL | Freq: Once | INTRAVENOUS | Status: AC
  Administered 2023-07-15: 1000 mL via INTRAVENOUS

## 2023-07-15 MED ORDER — FAMOTIDINE IN NACL 20-0.9 MG/50ML-% IV SOLN
20.0000 mg | Freq: Once | INTRAVENOUS | Status: AC
  Administered 2023-07-15: 20 mg via INTRAVENOUS
  Filled 2023-07-15: qty 50

## 2023-07-15 MED ORDER — ONDANSETRON HCL 4 MG/2ML IJ SOLN
4.0000 mg | Freq: Once | INTRAMUSCULAR | Status: AC
  Administered 2023-07-15: 4 mg via INTRAVENOUS
  Filled 2023-07-15: qty 2

## 2023-07-15 NOTE — ED Provider Notes (Signed)
 [facility] Provider Note    Event Date/Time   First MD Initiated Contact with Patient 07/15/23 1508     (approximate)  History   Chief Complaint: Dehydration  HPI  Alyssa Rojas is a 23 y.o. female with a past medical history of anxiety, approximately [redacted] weeks pregnant who presents to the emergency department for nausea and vomiting.  According to the patient she has a history of hyperemesis gravidarum, had the same with her prior pregnancy and again during this pregnancy.  Patient is approximately [redacted] weeks pregnant currently.  Patient denies any abdominal pain denies any vaginal bleeding fluid leakage or discharge.  No fever.  Patient is actively dry heaving during my evaluation.    Physical Exam   Triage Vital Signs: ED Triage Vitals [07/15/23 1453]  Encounter Vitals Group     BP (!) 125/93     Girls Systolic BP Percentile      Girls Diastolic BP Percentile      Boys Systolic BP Percentile      Boys Diastolic BP Percentile      Pulse Rate (!) 117     Resp 18     Temp (!) 97.5 F (36.4 C)     Temp Source Oral     SpO2 100 %     Weight 98 lb (44.5 kg)     Height 5' 1 (1.549 m)     Head Circumference      Peak Flow      Pain Score 5     Pain Loc      Pain Education      Exclude from Growth Chart     Most recent vital signs: Vitals:   07/15/23 1453  BP: (!) 125/93  Pulse: (!) 117  Resp: 18  Temp: (!) 97.5 F (36.4 C)  SpO2: 100%    General: Awake, no distress.  CV:  Good peripheral perfusion.  Regular rate and rhythm  Resp:  Normal effort.  Equal breath sounds bilaterally.  Abd:  No distention.  Soft, nontender.  No rebound or guarding.  ED Results / Procedures / Treatments   MEDICATIONS ORDERED IN ED: Medications  sodium chloride  0.9 % bolus 1,000 mL (has no administration in time range)  famotidine  (PEPCID ) IVPB 20 mg premix (has no administration in time range)  ondansetron  (ZOFRAN ) injection 4 mg (has no administration in  time range)     IMPRESSION / MDM / ASSESSMENT AND PLAN / ED COURSE  I reviewed the triage vital signs and the nursing notes.  Patient's presentation is most consistent with acute presentation with potential threat to life or bodily function.  Patient presents to the emergency department for nausea and vomiting approximately [redacted] weeks pregnant.  Patient has a history of hyperemesis gravidarum with her prior pregnancy and again with this pregnancy.  Patient is prescribed Zofran  and Reglan  to be used at home but she has not been able to keep these medications down due to nausea and vomiting.  Patient states her nausea vomiting worsened significantly last night and has continued today.  Patient is unable to keep down any fluids today so they came to the emergency department.  On examination patient actively dry heaving denies any abdominal pain has a benign abdominal exam.  Patient's lab work shows a slight leukocytosis otherwise reassuring CBC, reassuring chemistry normal LFTs, normal lipase.  We will IV hydrate, she states IV Pepcid  seems to work very well for her we will dose IV  Pepcid , Zofran , IV fluids and continue to closely monitor while awaiting further results.  Patient agreeable to plan.  Patient's labs are reassuring reassuring CBC chemistry and lipase.  Patient is feeling much better after medications.  States she feels well and wishes to go home.  Patient still has Reglan  and Zofran  to use at home and will follow-up with her OB.  FINAL CLINICAL IMPRESSION(S) / ED DIAGNOSES   Hyperemesis gravidarum   Note:  This document was prepared using Dragon voice recognition software and may include unintentional dictation errors.   Dorothyann Drivers, MD 07/15/23 1810

## 2023-07-15 NOTE — ED Triage Notes (Signed)
 Pt arrives to ED with body pain and N/V. Pt is [redacted]wks pregnant. Pt is G4P1A2

## 2023-07-15 NOTE — ED Notes (Signed)
 Pt verbalizes understanding of discharge instructions. Opportunity for questioning and answers were provided. Pt discharged from ED to home with mother.   ? ?

## 2023-07-15 NOTE — ED Notes (Signed)
 Pt reports improvement in sx's

## 2023-07-21 ENCOUNTER — Telehealth: Payer: Self-pay

## 2023-07-21 ENCOUNTER — Ambulatory Visit: Admission: RE | Admit: 2023-07-21 | Source: Ambulatory Visit

## 2023-07-21 DIAGNOSIS — O210 Mild hyperemesis gravidarum: Secondary | ICD-10-CM

## 2023-07-21 NOTE — Progress Notes (Unsigned)
 Complex Care Management Note Care Guide Note  07/21/2023 Name: Alyssa Rojas MRN: [id number] DOB: Mar 21, 2000   Complex Care Management Outreach Attempts: A second unsuccessful outreach was attempted today to offer the patient with information about available complex care management services.  Follow Up Plan:  Additional outreach attempts will be made to offer the patient complex care management information and services.   Encounter Outcome:  No Answer  Leotis Rase [facility], [facility] Guide  Direct Dial: [phone]  Fax [phone]

## 2023-07-22 NOTE — Progress Notes (Signed)
 Complex Care Management Note Care Guide Note  07/22/2023 Name: Alyssa Rojas MRN: [id number] DOB: 03-11-2000   Complex Care Management Outreach Attempts: A third unsuccessful outreach was attempted today to offer the patient with information about available complex care management services.  Follow Up Plan:  No further outreach attempts will be made at this time. We have been unable to contact the patient to offer or enroll patient in complex care management services.  Encounter Outcome:  No Answer  .BR

## 2023-07-30 DIAGNOSIS — Z3480 Encounter for supervision of other normal pregnancy, unspecified trimester: Secondary | ICD-10-CM | POA: Diagnosis not present

## 2023-07-30 DIAGNOSIS — O26899 Other specified pregnancy related conditions, unspecified trimester: Secondary | ICD-10-CM | POA: Diagnosis not present

## 2023-07-30 DIAGNOSIS — R7989 Other specified abnormal findings of blood chemistry: Secondary | ICD-10-CM | POA: Diagnosis not present

## 2023-08-17 ENCOUNTER — Emergency Department
Admission: EM | Admit: 2023-08-17 | Discharge: 2023-08-17 | Disposition: A | Discharge status: Home / Self Care | Attending: Emergency Medicine | Admitting: Emergency Medicine

## 2023-08-17 ENCOUNTER — Encounter: Payer: Self-pay | Admitting: *Deleted

## 2023-08-17 DIAGNOSIS — Z3A18 18 weeks gestation of pregnancy: Secondary | ICD-10-CM | POA: Diagnosis not present

## 2023-08-17 DIAGNOSIS — O219 Vomiting of pregnancy, unspecified: Secondary | ICD-10-CM | POA: Insufficient documentation

## 2023-08-17 DIAGNOSIS — R112 Nausea with vomiting, unspecified: Secondary | ICD-10-CM

## 2023-08-17 LAB — CBC
HCT: 37.1 % (ref 36.0–46.0)
Hemoglobin: 12 g/dL (ref 12.0–15.0)
MCH: 27.6 pg (ref 26.0–34.0)
MCHC: 32.3 g/dL (ref 30.0–36.0)
MCV: 85.3 fL (ref 80.0–100.0)
Platelets: 291 K/uL (ref 150–400)
RBC: 4.35 MIL/uL (ref 3.87–5.11)
RDW: 12.6 % (ref 11.5–15.5)
WBC: 17.3 K/uL — ABNORMAL HIGH (ref 4.0–10.5)
nRBC: 0 % (ref 0.0–0.2)

## 2023-08-17 LAB — COMPREHENSIVE METABOLIC PANEL WITH GFR
ALT: 12 U/L (ref 0–44)
AST: 28 U/L (ref 15–41)
Albumin: 3.2 g/dL — ABNORMAL LOW (ref 3.5–5.0)
Alkaline Phosphatase: 48 U/L (ref 38–126)
Anion gap: 15 (ref 5–15)
BUN: 9 mg/dL (ref 6–20)
CO2: 18 mmol/L — ABNORMAL LOW (ref 22–32)
Calcium: 9.3 mg/dL (ref 8.9–10.3)
Chloride: 106 mmol/L (ref 98–111)
Creatinine, Ser: 0.53 mg/dL (ref 0.44–1.00)
GFR, Estimated: >60 mL/min (ref >60)
Glucose, Bld: 148 mg/dL — ABNORMAL HIGH (ref 70–99)
Potassium: 3.6 mmol/L (ref 3.5–5.1)
Sodium: 139 mmol/L (ref 135–145)
Total Bilirubin: 0.7 mg/dL (ref 0.0–1.2)
Total Protein: 6.5 g/dL (ref 6.5–8.1)

## 2023-08-17 LAB — LIPASE, BLOOD: Lipase: 25 U/L (ref 11–51)

## 2023-08-17 MED ORDER — SODIUM CHLORIDE 0.9 % IV BOLUS
1000.0000 mL | Freq: Once | INTRAVENOUS | Status: AC
  Administered 2023-08-17: 1000 mL via INTRAVENOUS

## 2023-08-17 MED ORDER — ONDANSETRON HCL 4 MG/2ML IJ SOLN
4.0000 mg | Freq: Once | INTRAMUSCULAR | Status: AC
  Administered 2023-08-17: 4 mg via INTRAVENOUS
  Filled 2023-08-17: qty 2

## 2023-08-17 MED ORDER — ONDANSETRON HCL 4 MG PO TABS: 4.0000 mg | ORAL_TABLET | Freq: Four times a day (QID) | ORAL | 0 refills | Status: AC | PRN

## 2023-08-17 NOTE — Discharge Instructions (Addendum)
 I have sent a prescription for nausea medication as needed.  Follow-up with your OB/GYN as directed.  Return to the ER for any new or worsening symptoms.

## 2023-08-17 NOTE — ED Provider Notes (Signed)
 [facility] Provider Note    Event Date/Time   First MD Initiated Contact with Patient 08/17/23 2146     (approximate)   History   Emesis   HPI  Alyssa Rojas is a 23 year old female G4, P1 at [redacted] weeks gestation presenting to the emergency department for evaluation of nausea and vomiting.  Reports she has had issues with this throughout this pregnancy as well as prior pregnancies.  Did not have Zofran  at home which usually works well for her, took Reglan  but had ongoing dry heaving leading her to present to the ER.  Reports mild abdominal pain, not new for her, denies other complaints.     Physical Exam   Triage Vital Signs: ED Triage Vitals  Encounter Vitals Group     BP 08/17/23 2135 120/60     Girls Systolic BP Percentile --      Girls Diastolic BP Percentile --      Boys Systolic BP Percentile --      Boys Diastolic BP Percentile --      Pulse Rate 08/17/23 2135 100     Resp 08/17/23 2135 (!) 22     Temp --      Temp Source 08/17/23 2135 Oral     SpO2 08/17/23 2135 96 %     Weight 08/17/23 2132 111 lb (50.3 kg)     Height 08/17/23 2132 5' 1 (1.549 m)     Head Circumference --      Peak Flow --      Pain Score 08/17/23 2132 6     Pain Loc --      Pain Education --      Exclude from Growth Chart --     Most recent vital signs: Vitals:   08/17/23 2135  BP: 120/60  Pulse: 100  Resp: (!) 22  SpO2: 96%     General: Awake, interactive  CV:  Regular rate, good peripheral perfusion.  Resp:  Unlabored respirations.  Abd:  Nondistended, soft, mild tenderness in the lower quadrant, remainder of abdomen nontender Neuro:  Symmetric facial movement, fluid speech   ED Results / Procedures / Treatments   Labs (all labs ordered are listed, but only abnormal results are displayed) Labs Reviewed  COMPREHENSIVE METABOLIC PANEL WITH GFR - Abnormal; Notable for the following components:      Result Value   CO2 18 (*)    Glucose, Bld 148 (*)     Albumin 3.2 (*)    All other components within normal limits  CBC - Abnormal; Notable for the following components:   WBC 17.3 (*)    All other components within normal limits  LIPASE, BLOOD  URINALYSIS, ROUTINE W REFLEX MICROSCOPIC     EKG EKG independently reviewed and interpreted by myself demonstrates:    RADIOLOGY Imaging independently reviewed and interpreted by myself demonstrates:   Formal Radiology Read:  No results found.  PROCEDURES:  Critical Care performed: No  Procedures   MEDICATIONS ORDERED IN ED: Medications  ondansetron  (ZOFRAN ) injection 4 mg (4 mg Intravenous Given 08/17/23 2215)  sodium chloride  0.9 % bolus 1,000 mL (1,000 mLs Intravenous New Bag/Given 08/17/23 2216)     IMPRESSION / MDM / ASSESSMENT AND PLAN / ED COURSE  I reviewed the triage vital signs and the nursing notes.  Differential diagnosis includes, but is not limited to, nausea and vomiting associated with pregnancy, has had ultrasound in this pregnancy confirming IUP, persistent hyperemesis the patient is outside  first trimester pregnancy  Patient's presentation is most consistent with acute complicated illness / injury requiring diagnostic workup.  23 year old female presenting with vomiting.  Mild abdominal tenderness on exam. I did discuss MRI for further evaluation, but patient reports she has had similar pain multiple times, would prefer to hold off, does understand need to return if this worsens.  Expresses understanding of possibility of missed appendicitis or other pathology.  She does request IV fluids and Zofran  as this has been effective for her in the past.  Labs with leukocytosis, noted multiple times previously, likely reactive.  CMP without significant derangement.  Normal lipase.  Patient reevaluated after IV fluids and Zofran .  She reports complete resolution of her abdominal pain.  Her nausea significantly improved.  She is interested in discharge home.  Has ran out of  her Zofran  at home, so we will send prescription for this.  Strict return precautions provided.  Patient discharged in stable condition.      FINAL CLINICAL IMPRESSION(S) / ED DIAGNOSES   Final diagnoses:  Nausea and vomiting, unspecified vomiting type     Rx / DC Orders   ED Discharge Orders          Ordered    ondansetron  (ZOFRAN ) 4 MG tablet  Every 6 hours PRN        08/17/23 2336             Note:  This document was prepared using Dragon voice recognition software and may include unintentional dictation errors.   Levander Slate, MD 08/17/23 [id number]

## 2023-08-17 NOTE — ED Triage Notes (Addendum)
 Pt to triage via wheelchair.  Pt is approx [redacted] weeks pregnant.  Pt taking reglan  for n/v  no vag bleeding   no abd pain or cramping.  Sx began last night.  Pt alert.    Pt vomiting in triage.   [id number]    pt to room 4

## 2023-08-22 ENCOUNTER — Emergency Department
Admission: EM | Admit: 2023-08-22 | Discharge: 2023-08-22 | Disposition: A | Discharge status: Home / Self Care | Payer: Self-pay

## 2023-08-22 DIAGNOSIS — O99112 Other diseases of the blood and blood-forming organs and certain disorders involving the immune mechanism complicating pregnancy, second trimester: Secondary | ICD-10-CM | POA: Insufficient documentation

## 2023-08-22 DIAGNOSIS — Z3A19 19 weeks gestation of pregnancy: Secondary | ICD-10-CM | POA: Insufficient documentation

## 2023-08-22 DIAGNOSIS — D72829 Elevated white blood cell count, unspecified: Secondary | ICD-10-CM | POA: Insufficient documentation

## 2023-08-22 DIAGNOSIS — R8289 Other abnormal findings on cytological and histological examination of urine: Secondary | ICD-10-CM | POA: Insufficient documentation

## 2023-08-22 DIAGNOSIS — Z3492 Encounter for supervision of normal pregnancy, unspecified, second trimester: Secondary | ICD-10-CM

## 2023-08-22 DIAGNOSIS — O210 Mild hyperemesis gravidarum: Secondary | ICD-10-CM

## 2023-08-22 DIAGNOSIS — O200 Threatened abortion: Secondary | ICD-10-CM | POA: Insufficient documentation

## 2023-08-22 LAB — CBC
HCT: 37.5 % (ref 36.0–46.0)
Hemoglobin: 12.4 g/dL (ref 12.0–15.0)
MCH: 28.1 pg (ref 26.0–34.0)
MCHC: 33.1 g/dL (ref 30.0–36.0)
MCV: 84.8 fL (ref 80.0–100.0)
Platelets: 298 K/uL (ref 150–400)
RBC: 4.42 MIL/uL (ref 3.87–5.11)
RDW: 12.8 % (ref 11.5–15.5)
WBC: 13.7 K/uL — ABNORMAL HIGH (ref 4.0–10.5)
nRBC: 0 % (ref 0.0–0.2)

## 2023-08-22 LAB — URINALYSIS, ROUTINE W REFLEX MICROSCOPIC
Bilirubin Urine: NEGATIVE
Glucose, UA: NEGATIVE mg/dL
Hgb urine dipstick: NEGATIVE
Ketones, ur: 80 mg/dL — AB
Leukocytes,Ua: NEGATIVE
Nitrite: NEGATIVE
Protein, ur: 30 mg/dL — AB
Specific Gravity, Urine: 1.02 (ref 1.005–1.030)
pH: 6 (ref 5.0–8.0)

## 2023-08-22 LAB — HCG, QUANTITATIVE, PREGNANCY: hCG, Beta Chain, Quant, S: 66574 m[IU]/mL — ABNORMAL HIGH (ref <5)

## 2023-08-22 LAB — COMPREHENSIVE METABOLIC PANEL WITH GFR
ALT: 10 U/L (ref 0–44)
AST: 18 U/L (ref 15–41)
Albumin: 3.7 g/dL (ref 3.5–5.0)
Alkaline Phosphatase: 49 U/L (ref 38–126)
Anion gap: 11 (ref 5–15)
BUN: 8 mg/dL (ref 6–20)
CO2: 22 mmol/L (ref 22–32)
Calcium: 9.3 mg/dL (ref 8.9–10.3)
Chloride: 106 mmol/L (ref 98–111)
Creatinine, Ser: 0.56 mg/dL (ref 0.44–1.00)
GFR, Estimated: >60 mL/min (ref >60)
Glucose, Bld: 105 mg/dL — ABNORMAL HIGH (ref 70–99)
Potassium: 3.9 mmol/L (ref 3.5–5.1)
Sodium: 139 mmol/L (ref 135–145)
Total Bilirubin: 0.8 mg/dL (ref 0.0–1.2)
Total Protein: 7.1 g/dL (ref 6.5–8.1)

## 2023-08-22 LAB — LIPASE, BLOOD: Lipase: 25 U/L (ref 11–51)

## 2023-08-22 MED ORDER — PROMETHAZINE HCL 25 MG RE SUPP
25.0000 mg | Freq: Once | RECTAL | Status: AC
  Administered 2023-08-22: 25 mg via RECTAL
  Filled 2023-08-22: qty 1

## 2023-08-22 MED ORDER — PROMETHAZINE HCL 25 MG RE SUPP: 25.0000 mg | Freq: Four times a day (QID) | RECTAL | 1 refills | Status: DC | PRN

## 2023-08-22 MED ORDER — METOCLOPRAMIDE HCL 5 MG/ML IJ SOLN
5.0000 mg | Freq: Once | INTRAMUSCULAR | Status: AC
  Administered 2023-08-22: 5 mg via INTRAVENOUS
  Filled 2023-08-22: qty 2

## 2023-08-22 MED ORDER — SODIUM CHLORIDE 0.9 % IV BOLUS
500.0000 mL | Freq: Once | INTRAVENOUS | Status: AC
  Administered 2023-08-22: 500 mL via INTRAVENOUS

## 2023-08-22 MED ORDER — LACTATED RINGERS IV BOLUS
1000.0000 mL | Freq: Once | INTRAVENOUS | Status: AC
  Administered 2023-08-22: 1000 mL via INTRAVENOUS

## 2023-08-22 MED ORDER — DIPHENHYDRAMINE HCL 50 MG/ML IJ SOLN
12.5000 mg | Freq: Once | INTRAMUSCULAR | Status: AC
  Administered 2023-08-22: 12.5 mg via INTRAVENOUS
  Filled 2023-08-22: qty 1

## 2023-08-22 MED ORDER — ONDANSETRON HCL 4 MG/2ML IJ SOLN
4.0000 mg | Freq: Once | INTRAMUSCULAR | Status: AC
  Administered 2023-08-22: 4 mg via INTRAVENOUS
  Filled 2023-08-22: qty 2

## 2023-08-22 NOTE — Discharge Instructions (Addendum)
 You were seen in the emergency department for intractable nausea and vomiting in the setting of second trimester of pregnancy.  You are given medications that may make you sleepy.  Please rest and hydrate with the knowledge that fluids are more important than solids.  Please do not be afraid to use your Phenergan  suppositories as prescribed.  Please follow-up with your OB/GYN this week.  Return with any acutely worsening symptoms or any other emergency. -- RETURN PRECAUTIONS & AFTERCARE: (ENGLISH) RETURN PRECAUTIONS: Return immediately to the emergency department or see/call your doctor if you feel worse, weak or have changes in speech or vision, are short of breath, have fever, vomiting, pain, bleeding or dark stool, trouble urinating or any new issues. Return here or see/call your doctor if not improving as expected for your suspected condition. FOLLOW-UP CARE: Call your doctor and/or any doctors we referred you to for more advice and to make an appointment. Do this today, tomorrow or after the weekend. Some doctors only take PPO insurance so if you have HMO insurance you may want to contact your HMO or your regular doctor for referral to a specialist within your plan. Either way tell the doctor's office that it was a referral from the emergency department so you get the soonest possible appointment.  YOUR TEST RESULTS: Take result reports of any blood or urine tests, imaging tests and EKG's to your doctor and any referral doctor. Have any abnormal tests repeated. Your doctor or a referral doctor can let you know when this should be done. Also make sure your doctor contacts this hospital to get any test results that are not currently available such as cultures or special tests for infection and final imaging reports, which are often not available at the time you leave the ER but which may list additional important findings that are not documented on the preliminary report. BLOOD PRESSURE: If your blood  pressure was greater than 120/80 have your blood pressure rechecked within 1 to 2 weeks. MEDICATION SIDE EFFECTS: Do not drive, walk, bike, take the bus, etc. if you have received or are being prescribed any sedating medications such as those for pain or anxiety or certain antihistamines like Benadryl . If you have been give one of these here get a taxi home or have a friend drive you home. Ask your pharmacist to counsel you on potential side effects of any new medication

## 2023-08-22 NOTE — ED Triage Notes (Signed)
 Pt to ED from home [redacted] weeks pregnant with right sided abdominal pain and consistent vomiting since this morning. Pt states she has a hx of hyperemesis during pregnancy which is typically managed with zofran . Denies recent fever/urinary symptoms.

## 2023-08-22 NOTE — ED Provider Notes (Signed)
 [facility] Provider Note    Event Date/Time   First MD Initiated Contact with Patient 08/22/23 1349     (approximate)   History   Abdominal Pain and Emesis   HPI  Alyssa Rojas is a 23 y.o. female who has a history of smoking and who is currently [redacted] weeks pregnant who presents to the emergency department with intractable nausea and vomiting.  Patient states that nausea has been a problem for her throughout her pregnancy.  She is prescribed Phenergan  suppositories Reglan  and Zofran  and has taken Zofran  and Reglan  today without resolve of her symptoms.  She does follow-up regularly with an OB/GYN.  Denies any abdominal pain cramping or vaginal bleeding.  She reports needing to be hospitalized during her first pregnancy.  She does still smoke but is trying to cut down.  She presents with her husband who is at bedside and contributes to the history      Physical Exam   Triage Vital Signs: ED Triage Vitals  Encounter Vitals Group     BP 08/22/23 1333 97/81     Girls Systolic BP Percentile --      Girls Diastolic BP Percentile --      Boys Systolic BP Percentile --      Boys Diastolic BP Percentile --      Pulse Rate 08/22/23 1333 (!) 122     Resp 08/22/23 1333 20     Temp 08/22/23 1333 98.1 F (36.7 C)     Temp Source 08/22/23 1333 Oral     SpO2 08/22/23 1333 97 %     Weight 08/22/23 1334 111 lb (50.3 kg)     Height 08/22/23 1334 5' 1 (1.549 m)     Head Circumference --      Peak Flow --      Pain Score 08/22/23 1334 5     Pain Loc --      Pain Education --      Exclude from Growth Chart --     Most recent vital signs: Vitals:   08/22/23 1333 08/22/23 1617  BP: 97/81 106/82  Pulse: (!) 122 96  Resp: 20 18  Temp: 98.1 F (36.7 C) 98.1 F (36.7 C)  SpO2: 97% 98%    Nursing Triage Note reviewed. Vital signs reviewed and patients oxygen saturation is normoxic  General: Patient is well nourished, well developed, awake and alert, resting  comfortably, dry heaving Head: Normocephalic and atraumatic Eyes: Normal inspection, extraocular muscles intact, no conjunctival pallor Ear, nose, throat: Normal external exam Neck: Normal range of motion Respiratory: Patient is in no respiratory distress, lungs CTAB Cardiovascular: Patient is not tachycardic, RRR without murmur appreciated GI: Abd SNT with no guarding or rebound, appropriately gravid with tip of the fundus below the umbilicus Back: Normal inspection of the back with good strength and range of motion throughout all ext Extremities: pulses intact with good cap refills, no LE pitting edema or calf tenderness Neuro: The patient is alert and oriented to person, place, and time, appropriately conversive, with 5/5 bilat UE/LE strength, no gross motor or sensory defects noted. Coordination appears to be adequate. Skin: Warm, dry, and intact Psych: normal mood and affect, no SI or HI  ED Results / Procedures / Treatments   Labs (all labs ordered are listed, but only abnormal results are displayed) Labs Reviewed  COMPREHENSIVE METABOLIC PANEL WITH GFR - Abnormal; Notable for the following components:      Result Value  Glucose, Bld 105 (*)    All other components within normal limits  CBC - Abnormal; Notable for the following components:   WBC 13.7 (*)    All other components within normal limits  URINALYSIS, ROUTINE W REFLEX MICROSCOPIC - Abnormal; Notable for the following components:   Color, Urine YELLOW (*)    APPearance HAZY (*)    Ketones, ur 80 (*)    Protein, ur 30 (*)    Bacteria, UA RARE (*)    All other components within normal limits  HCG, QUANTITATIVE, PREGNANCY - Abnormal; Notable for the following components:   hCG, Beta Chain, Quant, S 33,425 (*)    All other components within normal limits  LIPASE, BLOOD  POC URINE PREG, ED     EKG None  RADIOLOGY None   PROCEDURES:  Critical Care performed: No  Procedures   MEDICATIONS ORDERED IN  ED: Medications  lactated ringers  bolus 1,000 mL (0 mLs Intravenous Stopped 08/22/23 1537)  ondansetron  (ZOFRAN ) injection 4 mg (4 mg Intravenous Given 08/22/23 1412)  metoCLOPramide  (REGLAN ) injection 5 mg (5 mg Intravenous Given 08/22/23 1413)  diphenhydrAMINE  (BENADRYL ) injection 12.5 mg (12.5 mg Intravenous Given 08/22/23 1411)  promethazine  (PHENERGAN ) suppository 25 mg (25 mg Rectal Given 08/22/23 1447)  sodium chloride  0.9 % bolus 500 mL (0 mLs Intravenous Stopped 08/22/23 1617)     IMPRESSION / MDM / ASSESSMENT AND PLAN / ED COURSE                                Differential diagnosis includes, but is not limited to, hyperemesis gravidarum, electrolyte derangement, intrauterine pregnancy, UTI,   ED course: Patient presents acutely with active nausea.  An IV was inserted and she was given IV fluids, Zofran , Benadryl  and Reglan  as it had been more than 6 hours since the last dose.  This was subsequently followed with a Phenergan  suppository.  Patient's symptoms abated and she felt improved.  Bedside ultrasound demonstrated intrauterine pregnancy with a fetal heart rate that was appropriate.  Urinalysis demonstrated no UTI and she had no profound electrolyte derangements or anemia.  She had only a very small amount of ketones in her urine.  She was able to tolerate p.o. here.  She was encouraged to use the Phenergan  suppositories and to cut down on smoking.  I have given her a refill of this medication.  She will follow-up with her OB/GYN this week.  All questions answered and patient and significant other requested discharge   Clinical Course as of 08/22/23 1638  Sat Aug 22, 2023  1352 WBC(!): 13.7 Slight leukocytosis [HD]  1435 Hemoglobin: 12.4 Not anemic [HD]  1435 Anion gap: 11 No profound anion gap [HD]  1435 Comprehensive metabolic panel(!) No profound electrolyte derangements [HD]  1529 I reassessed patient and she is feeling improved no longer having emesis.  I placed a point-of-care  ultrasound on the patient's abdomen which did demonstrate an intrauterine pregnancy with a fetal heart rate of approximately 152.  Abdomen is soft nontender.  She understands the need for giving a urine sample but expect that she will be able to be discharged today [HD]  1607 Ketones, ur(!): 80 Mildly elevated but the rest of the urinalysis is unremarkable [HD]    Clinical Course User Index [HD] Nicholaus Rolland BRAVO, MD   Risk: 5 This patient has a high risk of morbidity due to further diagnostic testing or treatment. Rationale: This patient's  evaluation and management involve a high risk of morbidity due to the potential severity of presenting symptoms, need for diagnostic testing, and/or initiation of treatment that may require close monitoring. The differential includes conditions with potential for significant deterioration or requiring escalation of care. Treatment decisions in the ED, including medication administration, procedural interventions, or disposition planning, reflect this level of risk. Additional Support: concern for hyperemesis gravidum in 2nd trimester pregnancy -- Drug therapy requiring intensive monitoring for toxicity [ ]  -- Decision regarding elective major surgery with idenitified patient or procedure risk factors [ ]  -- Decision regarding hospitalization or escalation of hospital-level care [ ]  -- Decision not to resuscitate or to de-escalate care because of poor prognosis [ ]  -- Parental controlled substances [ ]   COPA: 5 The patient has a severe exacerbation, progression, or side effect of treatment of the following illness/illnesses: []  OR  The patient has the following acute or chronic illness/injury that poses a possible threat to life or bodily function: [X] : The patient has a potentially serious acute condition or an acute exacerbation of a chronic illness requiring urgent evaluation and management in the Emergency Department. The clinical presentation necessitates  immediate consideration of life-threatening or function-threatening diagnoses, even if they are ultimately ruled out.  Data(2/3 categories following were performed): 4 I reviewed or ordered at least three unique tests, external notes, and/or the history required an independent historian as one of the three requirements as following: CBC, CMP, urinalysis AND  I independently interpreted the following test:  My own POC ultrasound OR  I discussed the management of the patient with the following external physician or qualified healthcare provider: []     Suggested E/M Coding Level: 5, 99285, This has been selected based on the 09/16/2021 CPT guidelines for E/M codes in the Emergency Department based on 2/3 of the CoPA, Data, and Risk.   FINAL CLINICAL IMPRESSION(S) / ED DIAGNOSES   Final diagnoses:  Hyperemesis gravidarum  Second trimester pregnancy     Rx / DC Orders   ED Discharge Orders          Ordered    promethazine  (PHENERGAN ) 25 MG suppository  Every 6 hours PRN        08/22/23 1610             Note:  This document was prepared using Dragon voice recognition software and may include unintentional dictation errors.   Nicholaus Rolland BRAVO, MD 08/22/23 [phone]

## 2023-08-27 DIAGNOSIS — Z3689 Encounter for other specified antenatal screening: Secondary | ICD-10-CM | POA: Diagnosis not present

## 2023-08-27 DIAGNOSIS — Z3480 Encounter for supervision of other normal pregnancy, unspecified trimester: Secondary | ICD-10-CM | POA: Diagnosis not present

## 2023-09-08 DIAGNOSIS — O35EXX0 Maternal care for other (suspected) fetal abnormality and damage, fetal genitourinary anomalies, not applicable or unspecified: Secondary | ICD-10-CM | POA: Diagnosis not present

## 2023-09-08 DIAGNOSIS — Z3A21 21 weeks gestation of pregnancy: Secondary | ICD-10-CM | POA: Diagnosis not present

## 2023-09-08 DIAGNOSIS — Z6791 Unspecified blood type, Rh negative: Secondary | ICD-10-CM | POA: Diagnosis not present

## 2023-09-08 DIAGNOSIS — Z363 Encounter for antenatal screening for malformations: Secondary | ICD-10-CM | POA: Diagnosis not present

## 2023-09-08 DIAGNOSIS — O360120 Maternal care for anti-D [Rh] antibodies, second trimester, not applicable or unspecified: Secondary | ICD-10-CM | POA: Diagnosis not present

## 2023-09-08 DIAGNOSIS — Z8759 Personal history of other complications of pregnancy, childbirth and the puerperium: Secondary | ICD-10-CM | POA: Diagnosis not present

## 2023-09-15 ENCOUNTER — Telehealth: Payer: Self-pay

## 2023-09-24 ENCOUNTER — Observation Stay
Admission: EM | Admit: 2023-09-24 | Discharge: 2023-09-24 | Disposition: A | Discharge status: Home / Self Care | Payer: Self-pay

## 2023-09-24 ENCOUNTER — Other Ambulatory Visit: Payer: Self-pay

## 2023-09-24 ENCOUNTER — Observation Stay: Payer: Self-pay

## 2023-09-24 ENCOUNTER — Encounter: Payer: Self-pay | Admitting: Obstetrics and Gynecology

## 2023-09-24 DIAGNOSIS — F1290 Cannabis use, unspecified, uncomplicated: Secondary | ICD-10-CM | POA: Insufficient documentation

## 2023-09-24 DIAGNOSIS — R252 Cramp and spasm: Secondary | ICD-10-CM | POA: Insufficient documentation

## 2023-09-24 DIAGNOSIS — F1090 Alcohol use, unspecified, uncomplicated: Secondary | ICD-10-CM | POA: Insufficient documentation

## 2023-09-24 DIAGNOSIS — O99312 Alcohol use complicating pregnancy, second trimester: Secondary | ICD-10-CM | POA: Diagnosis not present

## 2023-09-24 DIAGNOSIS — O479 False labor, unspecified: Secondary | ICD-10-CM | POA: Diagnosis not present

## 2023-09-24 DIAGNOSIS — O99322 Drug use complicating pregnancy, second trimester: Secondary | ICD-10-CM | POA: Diagnosis not present

## 2023-09-24 DIAGNOSIS — N898 Other specified noninflammatory disorders of vagina: Secondary | ICD-10-CM | POA: Insufficient documentation

## 2023-09-24 DIAGNOSIS — O26892 Other specified pregnancy related conditions, second trimester: Secondary | ICD-10-CM | POA: Diagnosis not present

## 2023-09-24 DIAGNOSIS — Z3A23 23 weeks gestation of pregnancy: Secondary | ICD-10-CM | POA: Insufficient documentation

## 2023-09-24 DIAGNOSIS — O26899 Other specified pregnancy related conditions, unspecified trimester: Principal | ICD-10-CM | POA: Diagnosis present

## 2023-09-24 DIAGNOSIS — O4702 False labor before 37 completed weeks of gestation, second trimester: Secondary | ICD-10-CM | POA: Diagnosis not present

## 2023-09-24 DIAGNOSIS — O99891 Other specified diseases and conditions complicating pregnancy: Secondary | ICD-10-CM | POA: Diagnosis not present

## 2023-09-24 LAB — URINALYSIS, COMPLETE (UACMP) WITH MICROSCOPIC
Bilirubin Urine: NEGATIVE
Glucose, UA: NEGATIVE mg/dL
Hgb urine dipstick: NEGATIVE
Ketones, ur: NEGATIVE mg/dL
Leukocytes,Ua: NEGATIVE
Nitrite: NEGATIVE
Protein, ur: NEGATIVE mg/dL
Specific Gravity, Urine: 1.028 (ref 1.005–1.030)
pH: 7 (ref 5.0–8.0)

## 2023-09-24 LAB — CHLAMYDIA/NGC RT PCR (ARMC ONLY)
Chlamydia Tr: NOT DETECTED
N gonorrhoeae: NOT DETECTED

## 2023-09-24 LAB — WET PREP, GENITAL
Clue Cells Wet Prep HPF POC: NONE SEEN
Sperm: NONE SEEN
Trich, Wet Prep: NONE SEEN
WBC, Wet Prep HPF POC: <10 (ref <10)
Yeast Wet Prep HPF POC: NONE SEEN

## 2023-09-24 LAB — FETAL FIBRONECTIN: Fetal Fibronectin: NEGATIVE

## 2023-09-24 MED ORDER — CALCIUM CARBONATE ANTACID 500 MG PO CHEW: 2.0000 | CHEWABLE_TABLET | ORAL | Status: DC | PRN

## 2023-09-24 MED ORDER — ONDANSETRON HCL 4 MG/2ML IJ SOLN
4.0000 mg | Freq: Three times a day (TID) | INTRAMUSCULAR | Status: DC | PRN
  Administered 2023-09-24: 4 mg via INTRAVENOUS
  Filled 2023-09-24: qty 2

## 2023-09-24 MED ORDER — LACTATED RINGERS IV SOLN: 125.0000 mL/h | Freq: Once | INTRAVENOUS | Status: DC | PRN

## 2023-09-24 MED ORDER — ACETAMINOPHEN 500 MG PO TABS: 1000.0000 mg | ORAL_TABLET | Freq: Four times a day (QID) | ORAL | Status: DC | PRN

## 2023-09-24 NOTE — Progress Notes (Signed)
 Patient discharged home, discharge instructions given, patient states understanding. Patient left floor in stable condition, denies any other needs at this time. Patient to schedule OB appointment

## 2023-09-24 NOTE — OB Triage Note (Signed)
 Patient arrived via EMS. Pt states her work called EMS due to her having braxton hicks.  Pt had a few that were 8/10 and then the rest were 5/10 pain scale. Pt states the last one ws on the way to the hospital. Pt also states she had to cancel OB appt today due to medicaid/insurance. Pt sats baby is moving well. Denies LOF, Vaginal bleeding, or problems voiding. Pt states she has had increased discharge, discharge has no smell, denies Itching, or other vagina concerns. Monitors applied and assessing

## 2023-09-24 NOTE — Discharge Instructions (Addendum)
 PRETERM LABOR: Includes any of the following symptoms that occur between 20-[redacted] weeks gestation. If these symptoms are not stopped, preterm labor can result in preterm delivery, placing your baby at risk.  Notify your doctor if any of the following occur: 1. Menstrual-like cramps   5. Pelvic pressure  2. Uterine contractions. These may be painless and feel like the uterus is tightening or the baby is balling up 6. Increase or change in vaginal discharge  3. Low, dull backache, unrelieved by heat or Tylenol   7. Vaginal bleeding  4. Intestinal cramps, with our without diarrhea, sometimes 8. A general feeling that something is not right   9. Leaking of fluid described as gas pain    Call your OB to schedule your next OB appointment. Call your provider or return to the ER if you have any questions or concerns.

## 2023-09-24 NOTE — Discharge Summary (Signed)
 Alyssa Rojas is a 23 y.o. female. She is at [redacted]w[redacted]d gestation. Patient's last menstrual period was 04/06/2023 (exact date). 01/18/2024, by Ultrasound   Prenatal care site: [facility]  Chief complaint: Braxton hicks contractions and vaginal discharge   Admission Diagnoses:  1) intrauterine pregnancy at [redacted]w[redacted]d  2) Cramping affecting pregnancy, antepartum [O26.899, R10.9]  Discharge Diagnoses:  Principal Problem:   Cramping affecting pregnancy, antepartum Active Problems:   Vaginal discharge during pregnancy  Braxton hicks contractions   HPI: Alyssa Rojas presents to L&D with complaints of braxton hicks contractions at work and increase in vaginal discharge. She reports a couple of braxton hicks contractions while she was at work today. They were originally more painful that what she has had in the past. She also reports an increase in vaginal discharge. Denies change in vaginal odor, itching, or watery discharge.  Her pregnancy is complicated by hyperemesis, subclinical hyperthyroidism, and RH negative .  She denies Contractions, Loss of fluid, or Vaginal bleeding. Endorses fetal movement as active. States she has not felt any contractions or cramping since leaving work.   S: Resting comfortably. no CTX, no VB.no LOF,  Active fetal movement.   Maternal Medical History:  Past Medical Hx:  has a past medical history of Anxiety, Headache(784.0), and Migraines.    Past Surgical Hx:  has a past surgical history that includes Tonsillectomy and adenoidectomy (2012) and Esophagogastroduodenoscopy (egd) with propofol  (N/A, 01/21/2022).   No Known Allergies   Prior to Admission medications   Medication Sig Start Date End Date Taking? Authorizing Provider  acetaminophen  (TYLENOL ) 325 MG tablet Take 2 tablets (650 mg total) by mouth every 4 (four) hours as needed (for pain scale < 4). 12/12/20  Yes Alyssa Rojas, Alyssa Rojas  ondansetron  (ZOFRAN -ODT) 4 MG disintegrating tablet Take 1 tablet  (4 mg total) by mouth every 8 (eight) hours as needed for nausea or vomiting. 07/07/23  Yes Menshew, Candida LULLA Kings, Alyssa Rojas  Prenatal Vit-Fe Fumarate-FA (PRENATAL VITAMINS) 28-0.8 MG TABS Take 1 tablet by mouth daily. 04/14/20  Yes Alyssa Smalls, Alyssa Rojas  promethazine  (PHENERGAN ) 25 MG suppository Place 1 suppository (25 mg total) rectally every 6 (six) hours as needed for nausea or vomiting. 08/22/23  Yes Alyssa Rolland BRAVO, Alyssa Rojas  benzocaine -Menthol  (DERMOPLAST) 20-0.Rojas % AERO Apply 1 application topically as needed for irritation (perineal discomfort). Patient not taking: Reported on 09/24/2023 12/12/20   Alyssa Rojas, Alyssa Rojas  ferrous gluconate  Alyssa Rojas) 240 (27 FE) MG tablet Take 1 tablet (240 mg total) by mouth in the morning and at bedtime. Patient not taking: Reported on 09/24/2023 12/12/20   Alyssa Rojas, Alyssa Rojas  omeprazole  (PRILOSEC  OTC) 20 MG tablet Take 1 tablet (20 mg total) by mouth daily. 09/27/21 09/27/22  Willo Dunnings, Alyssa Rojas  witch hazel-glycerin  (TUCKS) pad Apply 1 application topically as needed for hemorrhoids (for pain). Patient not taking: Reported on 09/24/2023 12/12/20   Alyssa Rojas, Alyssa Rojas  cetirizine (ZYRTEC) 10 MG tablet Take 10 mg by mouth. Patient not taking: No sig reported 11/21/13 04/14/20  Provider, Historical, Alyssa Rojas  Levonorgestrel-Ethinyl Estradiol (AMETHIA,CAMRESE) 0.15-0.03 &0.01 MG tablet Take 1 tablet by mouth at bedtime. Patient not taking: No sig reported 06/25/16 04/14/20  Janit Alm Agent, Alyssa Rojas  nortriptyline (PAMELOR) 10 MG capsule 1 cap po qhs x7d, then increase to 2 caps po qhs Patient not taking: No sig reported 12/26/15 04/14/20  Provider, Historical, Alyssa Rojas  omeprazole  (PRILOSEC ) 20 MG capsule Take 20 mg by mouth 2 (two) times daily.  04/14/20  Provider,  Historical, Alyssa Rojas  ranitidine (ZANTAC) 300 MG tablet Take 1 Tablet (300 MG Total) by Mouth ONE (1) TIME daily. 02/13/16 04/14/20  Provider, Historical, Alyssa Rojas  rizatriptan (MAXALT) 10 MG tablet Take 10 mg by mouth. Patient not  taking: No sig reported 04/07/16 04/14/20  Provider, Historical, Alyssa Rojas    Social History: She  reports that she has quit smoking. Her smoking use included e-cigarettes. She has never used smokeless tobacco. She reports that she does not currently use alcohol. She reports current drug use. Frequency: 3.00 times per week. Drug: Marijuana.  Family History: family history includes Healthy in her mother; Hypertension in her maternal grandmother and paternal grandfather; Migraines in her paternal grandfather; Stroke in her father and paternal grandfather.   Review of Systems: A full review of systems was performed and negative except as noted in the HPI.     Pertinent Results:   O:  BP (!) 95/49 (BP Location: Left Arm)   Pulse 83   Temp 97.8 F (36.6 C) (Oral)   Resp 18   LMP 04/06/2023 (Exact Date)  Results for orders placed or performed during the hospital encounter of 09/24/23 (from the past 48 hours)  Wet prep, genital   Collection Time: 09/24/23  1:31 PM  Result Value Ref Range   Yeast Wet Prep HPF POC NONE SEEN NONE SEEN   Trich, Wet Prep NONE SEEN NONE SEEN   Clue Cells Wet Prep HPF POC NONE SEEN NONE SEEN   WBC, Wet Prep HPF POC <10 <10   Sperm NONE SEEN   Chlamydia/NGC rt PCR ([facility] only)   Collection Time: 09/24/23  1:31 PM   Specimen: GU  Result Value Ref Range   Specimen source GC/Chlam ENDOCERVICAL    Chlamydia Tr NOT DETECTED NOT DETECTED   N gonorrhoeae NOT DETECTED NOT DETECTED  Fetal fibronectin   Collection Time: 09/24/23  1:31 PM  Result Value Ref Range   Fetal Fibronectin NEGATIVE NEGATIVE  Urinalysis, Complete w Microscopic -Urine, Clean Catch   Collection Time: 09/24/23  1:31 PM  Result Value Ref Range   Color, Urine YELLOW (A) YELLOW   APPearance CLEAR (A) CLEAR   Specific Gravity, Urine 1.028 1.005 - 1.030   pH 7.0 Rojas.0 - 8.0   Glucose, UA NEGATIVE NEGATIVE mg/dL   Hgb urine dipstick NEGATIVE NEGATIVE   Bilirubin Urine NEGATIVE NEGATIVE   Ketones, ur  NEGATIVE NEGATIVE mg/dL   Protein, ur NEGATIVE NEGATIVE mg/dL   Nitrite NEGATIVE NEGATIVE   Leukocytes,Ua NEGATIVE NEGATIVE   RBC / HPF 0-Rojas 0 - Rojas RBC/hpf   WBC, UA 0-Rojas 0 - Rojas WBC/hpf   Bacteria, UA RARE (A) NONE SEEN   Squamous Epithelial / HPF 0-Rojas 0 - Rojas /HPF   Mucus PRESENT     No results found.  US  OB Limited Result Date: 09/24/2023 CLINICAL DATA:  Cramping.  Evaluate for AFI and cervical length. EXAM: LIMITED OBSTETRIC ULTRASOUND COMPARISON:  None Available. FINDINGS: Number of Fetuses: 1 Heart Rate:  144 bpm Movement: Detect Presentation: The cephalad Placental Location: Neck posterior Previa: No Amniotic Fluid (Subjective):  Within normal limits. AFI: 16.7 cm BPD: Rojas.7 cm 23 w  3 d MATERNAL FINDINGS: Cervix:  Appears closed.  The cervical length is 4.3 cm. Uterus/Adnexae: No abnormality visualized. IMPRESSION: Single live intrauterine pregnancy.  No acute findings. This exam is performed on an emergent basis and does not comprehensively evaluate fetal size, dating, or anatomy; follow-up complete OB US  should be considered if further fetal assessment is warranted.  Electronically Signed   By: Vanetta Chou M.D.   On: 09/24/2023 15:59   US  OB Transvaginal Result Date: 09/24/2023 CLINICAL DATA:  Cramping.  Evaluate for AFI and cervical length. EXAM: LIMITED OBSTETRIC ULTRASOUND COMPARISON:  None Available. FINDINGS: Number of Fetuses: 1 Heart Rate:  144 bpm Movement: Detect Presentation: The cephalad Placental Location: Neck posterior Previa: No Amniotic Fluid (Subjective):  Within normal limits. AFI: 16.7 cm BPD: Rojas.7 cm 23 w  3 d MATERNAL FINDINGS: Cervix:  Appears closed.  The cervical length is 4.3 cm. Uterus/Adnexae: No abnormality visualized. IMPRESSION: Single live intrauterine pregnancy.  No acute findings. This exam is performed on an emergent basis and does not comprehensively evaluate fetal size, dating, or anatomy; follow-up complete OB US  should be considered if further fetal assessment  is warranted. Electronically Signed   By: Vanetta Chou M.D.   On: 09/24/2023 15:59     Constitutional: NAD, AAOx3  PULM: nl respiratory effort Abd: gravid, non-tender Ext: Non-tender, Nonedmeatous Psych: mood appropriate, speech normal Pelvic : moderate amount of physiologic discharge Cervix: visually closed/long  Fetal monitor: Baseline FHR: 150 beats/min Decelerations: absent Toco: quiet  Time: at least 20 minutes   Consults: None  Procedures: CEFM, US    Hospital Course: The patient was admitted to Labor and Delivery Triage for observation. Pelvic exam was reassuring. Wet prep, FFN, GC/CT were negative. UA was negative. Cervical length was 4.3 cm. She did not have contractions or cramping after she left work. Strict PTL precautions reviewed. Instructed to follow up for prenatal appointment outpatient.  She was deemed stable for discharge and further outpatient management.   Discharge Condition: stable  Disposition: Discharge disposition: 01-Home or Self Care       Allergies as of 09/24/2023   No Known Allergies      Medication List     STOP taking these medications    benzocaine -Menthol  20-0.Rojas % Aero Commonly known as: DERMOPLAST   ferrous gluconate  240 (27 FE) MG tablet Commonly known as: FERGON   witch hazel-glycerin  pad Commonly known as: TUCKS       TAKE these medications    acetaminophen  325 MG tablet Commonly known as: Tylenol  Take 2 tablets (650 mg total) by mouth every 4 (four) hours as needed (for pain scale < 4).   omeprazole  20 MG tablet Commonly known as: PriLOSEC  OTC Take 1 tablet (20 mg total) by mouth daily.   ondansetron  4 MG disintegrating tablet Commonly known as: ZOFRAN -ODT Take 1 tablet (4 mg total) by mouth every 8 (eight) hours as needed for nausea or vomiting.   Prenatal Vitamins 28-0.8 MG Tabs Take 1 tablet by mouth daily.   promethazine  25 MG suppository Commonly known as: PHENERGAN  Place 1 suppository (25 mg  total) rectally every 6 (six) hours as needed for nausea or vomiting.        ----- Therisa CHRISTELLA Pillow, Alyssa Rojas  Certified Nurse Midwife [facility]

## 2023-10-01 ENCOUNTER — Telehealth: Payer: Self-pay

## 2023-10-01 DIAGNOSIS — O210 Mild hyperemesis gravidarum: Secondary | ICD-10-CM

## 2023-10-01 NOTE — Progress Notes (Signed)
 Complex Care Management Note Care Guide Note  10/01/2023 Name: Alyssa Rojas MRN: [id number] DOB: 2000/06/10   Complex Care Management Outreach Attempts: A third unsuccessful outreach was attempted today to offer the patient with information about available complex care management services.  Follow Up Plan:  No further outreach attempts will be made at this time. We have been unable to contact the patient to offer or enroll patient in complex care management services.  Encounter Outcome:  No Answer-Left voicemail  Leotis Rase [facility], [facility] Guide  Direct Dial: [phone]  Fax [phone]

## 2023-10-05 ENCOUNTER — Encounter: Payer: Self-pay | Admitting: Obstetrics and Gynecology

## 2023-10-05 ENCOUNTER — Other Ambulatory Visit: Payer: Self-pay

## 2023-10-05 ENCOUNTER — Observation Stay
Admission: EM | Admit: 2023-10-05 | Discharge: 2023-10-05 | Disposition: A | Discharge status: Home / Self Care | Payer: Self-pay | Attending: Obstetrics | Admitting: Obstetrics

## 2023-10-05 DIAGNOSIS — Z3A25 25 weeks gestation of pregnancy: Secondary | ICD-10-CM | POA: Insufficient documentation

## 2023-10-05 DIAGNOSIS — O26892 Other specified pregnancy related conditions, second trimester: Secondary | ICD-10-CM | POA: Insufficient documentation

## 2023-10-05 DIAGNOSIS — O23592 Infection of other part of genital tract in pregnancy, second trimester: Principal | ICD-10-CM | POA: Insufficient documentation

## 2023-10-05 DIAGNOSIS — O212 Late vomiting of pregnancy: Secondary | ICD-10-CM | POA: Diagnosis not present

## 2023-10-05 DIAGNOSIS — N898 Other specified noninflammatory disorders of vagina: Secondary | ICD-10-CM | POA: Diagnosis not present

## 2023-10-05 DIAGNOSIS — Z87891 Personal history of nicotine dependence: Secondary | ICD-10-CM | POA: Insufficient documentation

## 2023-10-05 DIAGNOSIS — R197 Diarrhea, unspecified: Secondary | ICD-10-CM | POA: Insufficient documentation

## 2023-10-05 DIAGNOSIS — O219 Vomiting of pregnancy, unspecified: Secondary | ICD-10-CM | POA: Diagnosis present

## 2023-10-05 DIAGNOSIS — O99891 Other specified diseases and conditions complicating pregnancy: Secondary | ICD-10-CM | POA: Diagnosis not present

## 2023-10-05 LAB — URINALYSIS, ROUTINE W REFLEX MICROSCOPIC
Bacteria, UA: NONE SEEN
Bilirubin Urine: NEGATIVE
Glucose, UA: NEGATIVE mg/dL
Hgb urine dipstick: NEGATIVE
Ketones, ur: 20 mg/dL — AB
Leukocytes,Ua: NEGATIVE
Nitrite: NEGATIVE
Protein, ur: 30 mg/dL — AB
Specific Gravity, Urine: 1.029 (ref 1.005–1.030)
pH: 5 (ref 5.0–8.0)

## 2023-10-05 LAB — CHLAMYDIA/NGC RT PCR (ARMC ONLY)
Chlamydia Tr: NOT DETECTED
N gonorrhoeae: NOT DETECTED

## 2023-10-05 LAB — COMPREHENSIVE METABOLIC PANEL WITH GFR
ALT: 8 U/L (ref 0–44)
AST: 26 U/L (ref 15–41)
Albumin: 2.6 g/dL — ABNORMAL LOW (ref 3.5–5.0)
Alkaline Phosphatase: 57 U/L (ref 38–126)
Anion gap: 9 (ref 5–15)
BUN: 6 mg/dL (ref 6–20)
CO2: 21 mmol/L — ABNORMAL LOW (ref 22–32)
Calcium: 8.1 mg/dL — ABNORMAL LOW (ref 8.9–10.3)
Chloride: 106 mmol/L (ref 98–111)
Creatinine, Ser: 0.45 mg/dL (ref 0.44–1.00)
GFR, Estimated: >60 mL/min (ref >60)
Glucose, Bld: 203 mg/dL — ABNORMAL HIGH (ref 70–99)
Potassium: 3.1 mmol/L — ABNORMAL LOW (ref 3.5–5.1)
Sodium: 136 mmol/L (ref 135–145)
Total Bilirubin: 0.6 mg/dL (ref 0.0–1.2)
Total Protein: 5.8 g/dL — ABNORMAL LOW (ref 6.5–8.1)

## 2023-10-05 LAB — WET PREP, GENITAL
Clue Cells Wet Prep HPF POC: NONE SEEN
Sperm: NONE SEEN
Trich, Wet Prep: NONE SEEN
WBC, Wet Prep HPF POC: >=10 — AB (ref <10)
Yeast Wet Prep HPF POC: NONE SEEN

## 2023-10-05 LAB — CBC
HCT: 33.9 % — ABNORMAL LOW (ref 36.0–46.0)
Hemoglobin: 11.2 g/dL — ABNORMAL LOW (ref 12.0–15.0)
MCH: 27.2 pg (ref 26.0–34.0)
MCHC: 33 g/dL (ref 30.0–36.0)
MCV: 82.3 fL (ref 80.0–100.0)
Platelets: 199 K/uL (ref 150–400)
RBC: 4.12 MIL/uL (ref 3.87–5.11)
RDW: 13.9 % (ref 11.5–15.5)
WBC: 13.7 K/uL — ABNORMAL HIGH (ref 4.0–10.5)
nRBC: 0 % (ref 0.0–0.2)

## 2023-10-05 MED ORDER — ACETAMINOPHEN 500 MG PO TABS
1000.0000 mg | ORAL_TABLET | Freq: Four times a day (QID) | ORAL | 0 refills | Status: AC | PRN
Start: 2023-10-05 — End: ?

## 2023-10-05 MED ORDER — THIAMINE HCL 100 MG/ML IJ SOLN: Freq: Once | INTRAVENOUS | Status: DC

## 2023-10-05 MED ORDER — PRENATAL MULTIVITAMIN CH
1.0000 | ORAL_TABLET | Freq: Every day | ORAL | Status: DC
  Filled 2023-10-05: qty 1

## 2023-10-05 MED ORDER — ADULT MULTIVITAMIN W/MINERALS CH
1.0000 | ORAL_TABLET | Freq: Every day | ORAL | Status: DC
  Administered 2023-10-05: 1 via ORAL
  Filled 2023-10-05: qty 1

## 2023-10-05 MED ORDER — THIAMINE HCL 100 MG/ML IJ SOLN
100.0000 mg | Freq: Every day | INTRAMUSCULAR | Status: DC
  Filled 2023-10-05: qty 1

## 2023-10-05 MED ORDER — ONDANSETRON HCL 4 MG/2ML IJ SOLN
INTRAMUSCULAR | Status: AC
  Filled 2023-10-05: qty 2

## 2023-10-05 MED ORDER — DEXTROSE-SODIUM CHLORIDE 5-0.45 % IV SOLN: INTRAVENOUS | Status: DC

## 2023-10-05 MED ORDER — FOLIC ACID 5 MG/ML IJ SOLN
1.0000 mg | Freq: Every day | INTRAMUSCULAR | Status: DC
  Administered 2023-10-05: 1 mg via INTRAVENOUS
  Filled 2023-10-05: qty 0.2

## 2023-10-05 MED ORDER — LACTATED RINGERS IV SOLN
INTRAVENOUS | Status: DC
  Administered 2023-10-05: 1000 mL via INTRAVENOUS

## 2023-10-05 MED ORDER — PROMETHAZINE HCL 25 MG RE SUPP: 25.0000 mg | Freq: Four times a day (QID) | RECTAL | 0 refills | Status: DC | PRN

## 2023-10-05 MED ORDER — ONDANSETRON 4 MG PO TBDP: 4.0000 mg | ORAL_TABLET | Freq: Three times a day (TID) | ORAL | Status: DC | PRN

## 2023-10-05 MED ORDER — LOPERAMIDE HCL 2 MG PO CAPS
4.0000 mg | ORAL_CAPSULE | Freq: Once | ORAL | Status: AC
  Administered 2023-10-05: 4 mg via ORAL
  Filled 2023-10-05: qty 2

## 2023-10-05 MED ORDER — ACETAMINOPHEN 500 MG PO TABS: 1000.0000 mg | ORAL_TABLET | Freq: Four times a day (QID) | ORAL | Status: DC | PRN

## 2023-10-05 MED ORDER — ONDANSETRON HCL 4 MG/2ML IJ SOLN
4.0000 mg | Freq: Three times a day (TID) | INTRAMUSCULAR | Status: DC | PRN
  Administered 2023-10-05: 4 mg via INTRAVENOUS

## 2023-10-05 MED ORDER — ONDANSETRON 4 MG PO TBDP: 4.0000 mg | ORAL_TABLET | Freq: Three times a day (TID) | ORAL | 1 refills | Status: DC | PRN

## 2023-10-05 NOTE — OB Triage Note (Signed)
 Patient is a 23 yo, G4P1, at 25 weeks 0 days. Patient presents with complaints of nausea and vomiting. Patient states she has experienced severe nausea and vomiting for the duration of this pregnancy. She reports the N/V had been semi well managed with zofran  and reglan  but she states she lost her medicaid and cannot afford PNC or meds at this time. Patient reports 5+ episodes of vomiting in the last hour and this has been going on since this morning. She reports she was able to keep her food down yesterday but not today. She denies any vaginal bleeding or LOF. Patient reports +FM. Monitors applied and assessing. VSS. Initial fetal heart tone 155. Aisha CNM notified of patients arrival to unit. Plan to place in observation for IV hydration, CBC. Gc/chlam, and wet prep. TOC consult placed.

## 2023-10-05 NOTE — H&P (Incomplete)
 ANTEPARTUM PROGRESS NOTE  Alyssa Rojas is a 23 y.o. [phone] at [redacted]w[redacted]d who is admitted for nausea and vomiting in pregnancy.  Estimated Date of Delivery: 01/18/24  Length of Stay:  0 Days. Admitted 10/05/2023  Subjective: Unable to keep food down today and diarrhea x6 today She reports:  -active fetal movement -no leakage of fluid -no vaginal bleeding -no contractions  Vitals:  BP 109/61 (BP Location: Left Arm)   Pulse 96   Temp 98.1 F (36.7 C) (Oral)   Resp 18   Ht 5' 1 (1.549 m)   Wt 54.4 kg   LMP 04/06/2023 (Exact Date)   BMI 22.67 kg/m  Physical Examination: OBGyn Exam  Fetal doppler tones:155 BPM Tocometry: quiet  No results found for this or any previous visit (from the past 48 hours).  No results found.  Current scheduled medications  ondansetron        folic acid   1 mg Intravenous Daily   loperamide   4 mg Oral Once   multivitamin with minerals  1 tablet Oral Daily   [START ON 10/06/2023] prenatal multivitamin  1 tablet Oral Q1200   thiamine  (VITAMIN B1) injection  100 mg Intravenous Daily    I have reviewed the patient's current medications.  ASSESSMENT: Patient Active Problem List   Diagnosis Date Noted   Nausea and vomiting during pregnancy 10/05/2023   Cramping affecting pregnancy, antepartum 09/24/2023   Vaginal discharge during pregnancy 09/24/2023   Renal abnormality of fetus on prenatal ultrasound 09/08/2023   Supervision of other normal pregnancy, antepartum 06/08/2023   Rh negative state in antepartum period 06/05/2020   Depression 04/07/2016   Migraine with aura and without status migrainosus, not intractable 03/28/2014   GERD (gastroesophageal reflux disease) 12/22/2012    PLAN: Continue routine antenatal care. -Iv fluids with added vitamins and minerals -IV antiemetics prn -Imodium  4mg  x1 -NPO, advance as tolerated  Lakeem Rozo, CNM 10/05/2023 5:53 PM  Bobbette Brunswick Certified Nurse Midwife [facility]  OB/[facility]N [facility]

## 2023-10-06 ENCOUNTER — Other Ambulatory Visit: Payer: Self-pay | Admitting: Obstetrics

## 2023-10-06 DIAGNOSIS — O99891 Other specified diseases and conditions complicating pregnancy: Secondary | ICD-10-CM | POA: Diagnosis not present

## 2023-10-06 DIAGNOSIS — N898 Other specified noninflammatory disorders of vagina: Secondary | ICD-10-CM | POA: Diagnosis not present

## 2023-10-06 DIAGNOSIS — R197 Diarrhea, unspecified: Secondary | ICD-10-CM | POA: Diagnosis not present

## 2023-10-06 DIAGNOSIS — O212 Late vomiting of pregnancy: Secondary | ICD-10-CM | POA: Diagnosis not present

## 2023-10-06 DIAGNOSIS — E876 Hypokalemia: Secondary | ICD-10-CM

## 2023-10-06 DIAGNOSIS — Z3A25 25 weeks gestation of pregnancy: Secondary | ICD-10-CM | POA: Diagnosis not present

## 2023-10-06 NOTE — Discharge Summary (Cosign Needed Addendum)
 Michaelle E Gau is a 23 y.o. female. She is at [redacted]w[redacted]d gestation. Patient's last menstrual period was 04/06/2023 (exact date). Estimated Date of Delivery: 01/18/24  Prenatal care site: [facility]  Chief complaint: N/V/D  HPI: Kandace presents to L&D with complaints of nausea, vomiting and diarrhea in pregnancy. She reports she was able to keep food down yesterday, but not today and now currently has diarrhea and has had 5-6 bowel movements today. She reports she has had a lapse in prenatal care because her medicaid was canceled, so she has not been able to come to her appointments  Factors complicating pregnancy: Patient Active Problem List   Diagnosis Date Noted   Nausea and vomiting during pregnancy 10/05/2023   Cramping affecting pregnancy, antepartum 09/24/2023   Vaginal discharge during pregnancy 09/24/2023   Renal abnormality of fetus on prenatal ultrasound 09/08/2023   Supervision of other normal pregnancy, antepartum 06/08/2023   Rh negative state in antepartum period 06/05/2020   Depression 04/07/2016   Migraine with aura and without status migrainosus, not intractable 03/28/2014   GERD (gastroesophageal reflux disease) 12/22/2012     S: Resting comfortably. no CTX, no VB.no LOF,  Active fetal movement.   Maternal Medical History:  Past Medical Hx:  has a past medical history of Anxiety, Headache(784.0), and Migraines.    Past Surgical Hx:  has a past surgical history that includes Tonsillectomy and adenoidectomy (2012) and Esophagogastroduodenoscopy (egd) with propofol  (N/A, 01/21/2022).   No Known Allergies   Prior to Admission medications   Medication Sig Start Date End Date Taking? Authorizing Provider  ondansetron  (ZOFRAN -ODT) 4 MG disintegrating tablet Take 1 tablet (4 mg total) by mouth every 8 (eight) hours as needed for nausea or vomiting. 10/05/23  Yes Aisha Heller, CNM  Prenatal Vit-Fe Fumarate-FA (PRENATAL VITAMINS) 28-0.8 MG TABS Take 1 tablet by  mouth daily. 04/14/20  Yes Angelena Smalls, MD  promethazine  (PHENERGAN ) 25 MG suppository Place 1 suppository (25 mg total) rectally every 6 (six) hours as needed for nausea or vomiting. 10/05/23  Yes Kae Lauman, CNM  acetaminophen  (TYLENOL ) 500 MG tablet Take 2 tablets (1,000 mg total) by mouth every 6 (six) hours as needed (for pain scale < 4  OR  temperature  >/=  100.5 F). 10/05/23   Aisha Heller, CNM  promethazine  (PHENERGAN ) 25 MG suppository Place 1 suppository (25 mg total) rectally every 6 (six) hours as needed for nausea or vomiting. 08/22/23   Nicholaus Rolland BRAVO, MD  cetirizine (ZYRTEC) 10 MG tablet Take 10 mg by mouth. Patient not taking: No sig reported 11/21/13 04/14/20  [provider]  Levonorgestrel-Ethinyl Estradiol (AMETHIA,CAMRESE) 0.15-0.03 &0.01 MG tablet Take 1 tablet by mouth at bedtime. Patient not taking: No sig reported 06/25/16 04/14/20  Janit Alm Agent, MD  nortriptyline (PAMELOR) 10 MG capsule 1 cap po qhs x7d, then increase to 2 caps po qhs Patient not taking: No sig reported 12/26/15 04/14/20  [provider]  omeprazole  (PRILOSEC ) 20 MG capsule Take 20 mg by mouth 2 (two) times daily.  04/14/20  [provider]  ranitidine (ZANTAC) 300 MG tablet Take 1 Tablet (300 MG Total) by Mouth ONE (1) TIME daily. 02/13/16 04/14/20  [provider]  rizatriptan (MAXALT) 10 MG tablet Take 10 mg by mouth. Patient not taking: No sig reported 04/07/16 04/14/20  [provider]    Social History: She  reports that she has quit smoking. Her smoking use included e-cigarettes. She has never used smokeless tobacco. She reports that  she does not currently use alcohol. She reports current drug use. Frequency: 3.00 times per week. Drug: Marijuana.  Family History: family history includes Healthy in her mother; Hypertension in her maternal grandmother and paternal grandfather; Migraines in her paternal grandfather; Stroke in her father and  paternal grandfather. ,no history of gyn cancers  Review of Systems: A full review of systems was performed and negative except as noted in the HPI.    O:  BP 109/61 (BP Location: Left Arm)   Pulse 96   Temp 98.1 F (36.7 C) (Oral)   Resp 18   Ht 5' 1 (1.549 m)   Wt 54.4 kg   LMP 04/06/2023 (Exact Date)   BMI 22.67 kg/m  Results for orders placed or performed during the hospital encounter of 10/05/23 (from the past 48 hours)  Wet prep, genital   Collection Time: 10/05/23  5:28 PM   Specimen: Urine, Clean Catch  Result Value Ref Range   Yeast Wet Prep HPF POC NONE SEEN NONE SEEN   Trich, Wet Prep NONE SEEN NONE SEEN   Clue Cells Wet Prep HPF POC NONE SEEN NONE SEEN   WBC, Wet Prep HPF POC >=10 (A) <10   Sperm NONE SEEN   Chlamydia/NGC rt PCR ([facility] only)   Collection Time: 10/05/23  5:28 PM   Specimen: Urine, Clean Catch; Genital  Result Value Ref Range   Specimen source GC/Chlam ENDOCERVICAL    Chlamydia Tr NOT DETECTED NOT DETECTED   N gonorrhoeae NOT DETECTED NOT DETECTED  Urinalysis, Routine w reflex microscopic -Urine, Clean Catch   Collection Time: 10/05/23  5:28 PM  Result Value Ref Range   Color, Urine YELLOW (A) YELLOW   APPearance HAZY (A) CLEAR   Specific Gravity, Urine 1.029 1.005 - 1.030   pH 5.0 5.0 - 8.0   Glucose, UA NEGATIVE NEGATIVE mg/dL   Hgb urine dipstick NEGATIVE NEGATIVE   Bilirubin Urine NEGATIVE NEGATIVE   Ketones, ur 20 (A) NEGATIVE mg/dL   Protein, ur 30 (A) NEGATIVE mg/dL   Nitrite NEGATIVE NEGATIVE   Leukocytes,Ua NEGATIVE NEGATIVE   RBC / HPF 0-5 0 - 5 RBC/hpf   WBC, UA 0-5 0 - 5 WBC/hpf   Bacteria, UA NONE SEEN NONE SEEN   Squamous Epithelial / HPF 0-5 0 - 5 /HPF   Mucus PRESENT   CBC   Collection Time: 10/05/23  5:44 PM  Result Value Ref Range   WBC 13.7 (H) 4.0 - 10.5 K/uL   RBC 4.12 3.87 - 5.11 MIL/uL   Hemoglobin 11.2 (L) 12.0 - 15.0 g/dL   HCT 66.0 (L) 63.9 - 53.9 %   MCV 82.3 80.0 - 100.0 fL   MCH 27.2 26.0 - 34.0 pg    MCHC 33.0 30.0 - 36.0 g/dL   RDW 86.0 88.4 - 84.4 %   Platelets 199 150 - 400 K/uL   nRBC 0.0 0.0 - 0.2 %  Comprehensive metabolic panel   Collection Time: 10/05/23  7:35 PM  Result Value Ref Range   Sodium 136 135 - 145 mmol/L   Potassium 3.1 (L) 3.5 - 5.1 mmol/L   Chloride 106 98 - 111 mmol/L   CO2 21 (L) 22 - 32 mmol/L   Glucose, Bld 203 (H) 70 - 99 mg/dL   BUN 6 6 - 20 mg/dL   Creatinine, Ser 9.54 0.44 - 1.00 mg/dL   Calcium  8.1 (L) 8.9 - 10.3 mg/dL   Total Protein 5.8 (L) 6.5 - 8.1 g/dL   Albumin  2.6 (L) 3.5 - 5.0 g/dL   AST 26 15 - 41 U/L   ALT 8 0 - 44 U/L   Alkaline Phosphatase 57 38 - 126 U/L   Total Bilirubin 0.6 0.0 - 1.2 mg/dL   GFR, Estimated >39 >39 mL/min   Anion gap 9 5 - 15     Constitutional: NAD, AAOx3  HE/ENT: extraocular movements grossly intact, moist mucous membranes CV: RRR PULM: nl respiratory effort, CTABL Abd: gravid, non-tender, non-distended, soft  Ext: Non-tender, Nonedmeatous Psych: mood appropriate, speech normal Pelvic : deferred SVE:     Fetal heart tones 155  Tocometry: quiet    Imaging Studies: US  OB Limited Result Date: 09/24/2023 CLINICAL DATA:  Cramping.  Evaluate for AFI and cervical length. EXAM: LIMITED OBSTETRIC ULTRASOUND COMPARISON:  None Available. FINDINGS: Number of Fetuses: 1 Heart Rate:  144 bpm Movement: Detect Presentation: The cephalad Placental Location: Neck posterior Previa: No Amniotic Fluid (Subjective):  Within normal limits. AFI: 16.7 cm BPD: 5.7 cm 23 w  3 d MATERNAL FINDINGS: Cervix:  Appears closed.  The cervical length is 4.3 cm. Uterus/Adnexae: No abnormality visualized. IMPRESSION: Single live intrauterine pregnancy.  No acute findings. This exam is performed on an emergent basis and does not comprehensively evaluate fetal size, dating, or anatomy; follow-up complete OB US  should be considered if further fetal assessment is warranted. Electronically Signed   By: Vanetta Chou M.D.   On: 09/24/2023 15:59    US  OB Transvaginal Result Date: 09/24/2023 CLINICAL DATA:  Cramping.  Evaluate for AFI and cervical length. EXAM: LIMITED OBSTETRIC ULTRASOUND COMPARISON:  None Available. FINDINGS: Number of Fetuses: 1 Heart Rate:  144 bpm Movement: Detect Presentation: The cephalad Placental Location: Neck posterior Previa: No Amniotic Fluid (Subjective):  Within normal limits. AFI: 16.7 cm BPD: 5.7 cm 23 w  3 d MATERNAL FINDINGS: Cervix:  Appears closed.  The cervical length is 4.3 cm. Uterus/Adnexae: No abnormality visualized. IMPRESSION: Single live intrauterine pregnancy.  No acute findings. This exam is performed on an emergent basis and does not comprehensively evaluate fetal size, dating, or anatomy; follow-up complete OB US  should be considered if further fetal assessment is warranted. Electronically Signed   By: Vanetta Chou M.D.   On: 09/24/2023 15:59    Assessment: 23 y.o.  with [redacted]w[redacted]d  pregnancy observed for about 4 hours for nausea, vomiting, and diarrhea. Treated with banana bag, antiemetics and Imodium  with good response. Symptoms resolved, tolerating clear liquids, and all pregnancy-related labs within normal limits. Patient stable and now tearful requesting discharge to return home to care for her small child who is upset and needs her to come home  Principle diagnosis:  The encounter diagnosis was Vaginal discharge during pregnancy in second trimester.   Plan: Labor: not present.  Iv fluids-banana bag antiemetics Imodium  4 mg x1 K+ low, Calcium  low\ K+ replacement sent to pharmacy D/c home stable, precautions reviewed, follow-up as scheduled.   ----- Bobbette Brunswick, CNM Certified Nurse Midwife [facility]

## 2023-10-13 ENCOUNTER — Ambulatory Visit: Payer: Self-pay

## 2023-11-12 ENCOUNTER — Emergency Department
Admission: EM | Admit: 2023-11-12 | Discharge: 2023-11-12 | Disposition: A | Discharge status: Home / Self Care | Attending: Emergency Medicine | Admitting: Emergency Medicine

## 2023-11-12 ENCOUNTER — Other Ambulatory Visit: Payer: Self-pay

## 2023-11-12 DIAGNOSIS — O26893 Other specified pregnancy related conditions, third trimester: Secondary | ICD-10-CM | POA: Diagnosis not present

## 2023-11-12 DIAGNOSIS — Z3A01 Less than 8 weeks gestation of pregnancy: Secondary | ICD-10-CM | POA: Diagnosis not present

## 2023-11-12 DIAGNOSIS — M7989 Other specified soft tissue disorders: Secondary | ICD-10-CM | POA: Insufficient documentation

## 2023-11-12 DIAGNOSIS — R040 Epistaxis: Secondary | ICD-10-CM | POA: Insufficient documentation

## 2023-11-12 DIAGNOSIS — Z3A30 30 weeks gestation of pregnancy: Secondary | ICD-10-CM | POA: Diagnosis not present

## 2023-11-12 DIAGNOSIS — Z3490 Encounter for supervision of normal pregnancy, unspecified, unspecified trimester: Secondary | ICD-10-CM

## 2023-11-12 LAB — CBC
HCT: 33.1 % — ABNORMAL LOW (ref 36.0–46.0)
Hemoglobin: 10.4 g/dL — ABNORMAL LOW (ref 12.0–15.0)
MCH: 24.8 pg — ABNORMAL LOW (ref 26.0–34.0)
MCHC: 31.4 g/dL (ref 30.0–36.0)
MCV: 78.8 fL — ABNORMAL LOW (ref 80.0–100.0)
Platelets: 169 K/uL (ref 150–400)
RBC: 4.2 MIL/uL (ref 3.87–5.11)
RDW: 14.1 % (ref 11.5–15.5)
WBC: 8.6 K/uL (ref 4.0–10.5)
nRBC: 0 % (ref 0.0–0.2)

## 2023-11-12 LAB — BASIC METABOLIC PANEL WITH GFR
Anion gap: 10 (ref 5–15)
BUN: <5 mg/dL — ABNORMAL LOW (ref 6–20)
CO2: 20 mmol/L — ABNORMAL LOW (ref 22–32)
Calcium: 8.6 mg/dL — ABNORMAL LOW (ref 8.9–10.3)
Chloride: 106 mmol/L (ref 98–111)
Creatinine, Ser: 0.34 mg/dL — ABNORMAL LOW (ref 0.44–1.00)
GFR, Estimated: >60 mL/min (ref >60)
Glucose, Bld: 124 mg/dL — ABNORMAL HIGH (ref 70–99)
Potassium: 3.3 mmol/L — ABNORMAL LOW (ref 3.5–5.1)
Sodium: 136 mmol/L (ref 135–145)

## 2023-11-12 LAB — URINALYSIS, ROUTINE W REFLEX MICROSCOPIC
Bilirubin Urine: NEGATIVE
Glucose, UA: NEGATIVE mg/dL
Hgb urine dipstick: NEGATIVE
Ketones, ur: 5 mg/dL — AB
Leukocytes,Ua: NEGATIVE
Nitrite: NEGATIVE
Protein, ur: NEGATIVE mg/dL
Specific Gravity, Urine: 1.004 — ABNORMAL LOW (ref 1.005–1.030)
pH: 7 (ref 5.0–8.0)

## 2023-11-12 NOTE — ED Provider Notes (Signed)
 Alyssa Rojas Provider Note    Event Date/Time   First MD Initiated Contact with Patient 11/12/23 1418     (approximate)   History   Leg Swelling   HPI  Alyssa Rojas is a 23 y.o. female who is [redacted] weeks pregnant presenting with intermittent lower extremity swelling and nosebleeds.  Denies swelling or nosebleeds at this time.  That she had some mild epistaxis from her left nare earlier today but that has resolved.  She does not feel any blood draining from the back of her nose into her mouth.  No chest pain or shortness of breath.  No gush of fluid or issues with pregnancy so far.  No unilateral calf swelling or tenderness.  No leg swelling at this time.  On independent review, she was seen by OB in September for nausea vomiting diarrhea during pregnancy, was flu resuscitated and discharged with outpatient follow-up.  Her last OB ultrasound was in early September that showed single live IUP without acute findings.     Physical Exam   Triage Vital Signs: ED Triage Vitals  Encounter Vitals Group     BP 11/12/23 1308 111/81     Girls Systolic BP Percentile --      Girls Diastolic BP Percentile --      Boys Systolic BP Percentile --      Boys Diastolic BP Percentile --      Pulse Rate 11/12/23 1308 96     Resp 11/12/23 1308 16     Temp 11/12/23 1308 97.9 F (36.6 C)     Temp Source 11/12/23 1308 Oral     SpO2 11/12/23 1308 94 %     Weight 11/12/23 1309 121 lb (54.9 kg)     Height 11/12/23 1309 5' 1 (1.549 m)     Head Circumference --      Peak Flow --      Pain Score 11/12/23 1309 0     Pain Loc --      Pain Education --      Exclude from Growth Chart --     Most recent vital signs: Vitals:   11/12/23 1308  BP: 111/81  Pulse: 96  Resp: 16  Temp: 97.9 F (36.6 C)  SpO2: 94%     General: Awake, no distress.  CV:  Good peripheral perfusion.  Resp:  Normal effort.  Abd:  No distention.  Other:  Well-appearing, no unilateral capsular  tenderness, no lower extremity edema, no swelling noted at this time.  Oropharynx is clear, no active epistaxis at this time.   ED Results / Procedures / Treatments   Labs (all labs ordered are listed, but only abnormal results are displayed) Labs Reviewed  CBC - Abnormal; Notable for the following components:      Result Value   Hemoglobin 10.4 (*)    HCT 33.1 (*)    MCV 78.8 (*)    MCH 24.8 (*)    All other components within normal limits  BASIC METABOLIC PANEL WITH GFR - Abnormal; Notable for the following components:   Potassium 3.3 (*)    CO2 20 (*)    Glucose, Bld 124 (*)    BUN <5 (*)    Creatinine, Ser 0.34 (*)    Calcium  8.6 (*)    All other components within normal limits  URINALYSIS, ROUTINE W REFLEX MICROSCOPIC - Abnormal; Notable for the following components:   Color, Urine STRAW (*)    APPearance HAZY (*)  Specific Gravity, Urine 1.004 (*)    Ketones, ur 5 (*)    All other components within normal limits      PROCEDURES:  Critical Care performed: No  Procedures   MEDICATIONS ORDERED IN ED: Medications - No data to display   IMPRESSION / MDM / ASSESSMENT AND PLAN / ED COURSE  I reviewed the triage vital signs and the nursing notes.                              Differential diagnosis includes, but is not limited to, epistaxis that is resolved, no evidence of leg swelling at this time, did consider DVT but given no swelling, no unilateral calf swelling or tenderness, no shortness of breath or chest pain, no indication for further workup at this time.  She denies any prior cardiac history, no history of CHF.  Did consider this could be venous stasis during pregnancy.  Labs were obtained at triage, no obvious derangements.  Patient is stable to be seen by OB at this time.  Will discharge her and have her go up to L&D to be assessed.  Shared decision making done with patient and family and they are agreeable with this plan.  Patient's presentation is most  consistent with acute presentation with potential threat to life or bodily function.     Clinical Course as of 11/12/23 1431  Thu Nov 12, 2023  1424 Independent review of labs, electrolytes not severely deranged, she is not acutely anemic, UA is not consistent with UTI, no leukocytosis. [TT]    Clinical Course User Index [TT] Waymond Lorelle Cummins, MD     FINAL CLINICAL IMPRESSION(S) / ED DIAGNOSES   Final diagnoses:  Epistaxis  Pregnancy, unspecified gestational age     Rx / DC Orders   ED Discharge Orders     None        Note:  This document was prepared using Dragon voice recognition software and may include unintentional dictation errors.    Waymond Lorelle Cummins, MD 11/12/23 [phone]

## 2023-11-12 NOTE — Discharge Instructions (Signed)
 Try a humidifier at home especially if you do get nosebleeds when he gets cold and dry.  Please see your OB/GYN for further management of your pregnancy.

## 2023-11-12 NOTE — ED Triage Notes (Signed)
 Pt arrives via POV from [facility]. Pt sts that she has been having lower leg swelling and nose bleeding from time to time. Pt sts that she is not having any leg swelling now.

## 2023-11-12 NOTE — ED Triage Notes (Signed)
 First nurse note: Pt to ED via POV from [facility]. Pt is G4P1 and sent due to nosebleeds and bilateral leg swelling.   L&D reporting pt to be seen down in ED first if vitals are stable.

## 2023-11-24 ENCOUNTER — Encounter: Payer: Self-pay | Admitting: Obstetrics and Gynecology

## 2023-11-24 ENCOUNTER — Observation Stay
Admission: EM | Admit: 2023-11-24 | Discharge: 2023-11-24 | Disposition: A | Discharge status: Home / Self Care | Attending: Certified Nurse Midwife | Admitting: Certified Nurse Midwife

## 2023-11-24 ENCOUNTER — Other Ambulatory Visit: Payer: Self-pay

## 2023-11-24 DIAGNOSIS — O219 Vomiting of pregnancy, unspecified: Secondary | ICD-10-CM | POA: Diagnosis not present

## 2023-11-24 DIAGNOSIS — O0993 Supervision of high risk pregnancy, unspecified, third trimester: Secondary | ICD-10-CM | POA: Diagnosis not present

## 2023-11-24 DIAGNOSIS — R112 Nausea with vomiting, unspecified: Secondary | ICD-10-CM | POA: Diagnosis present

## 2023-11-24 DIAGNOSIS — R42 Dizziness and giddiness: Secondary | ICD-10-CM | POA: Diagnosis not present

## 2023-11-24 DIAGNOSIS — O99891 Other specified diseases and conditions complicating pregnancy: Secondary | ICD-10-CM | POA: Diagnosis not present

## 2023-11-24 DIAGNOSIS — Z3A32 32 weeks gestation of pregnancy: Secondary | ICD-10-CM | POA: Diagnosis not present

## 2023-11-24 DIAGNOSIS — Z1152 Encounter for screening for COVID-19: Secondary | ICD-10-CM | POA: Diagnosis not present

## 2023-11-24 DIAGNOSIS — O99323 Drug use complicating pregnancy, third trimester: Secondary | ICD-10-CM | POA: Insufficient documentation

## 2023-11-24 DIAGNOSIS — F1290 Cannabis use, unspecified, uncomplicated: Secondary | ICD-10-CM | POA: Insufficient documentation

## 2023-11-24 DIAGNOSIS — O212 Late vomiting of pregnancy: Secondary | ICD-10-CM | POA: Diagnosis not present

## 2023-11-24 DIAGNOSIS — N898 Other specified noninflammatory disorders of vagina: Principal | ICD-10-CM

## 2023-11-24 LAB — CBC
HCT: 33.7 % — ABNORMAL LOW (ref 36.0–46.0)
Hemoglobin: 10.8 g/dL — ABNORMAL LOW (ref 12.0–15.0)
MCH: 24.4 pg — ABNORMAL LOW (ref 26.0–34.0)
MCHC: 32 g/dL (ref 30.0–36.0)
MCV: 76.1 fL — ABNORMAL LOW (ref 80.0–100.0)
Platelets: 185 K/uL (ref 150–400)
RBC: 4.43 MIL/uL (ref 3.87–5.11)
RDW: 14.6 % (ref 11.5–15.5)
WBC: 12.7 K/uL — ABNORMAL HIGH (ref 4.0–10.5)
nRBC: 0 % (ref 0.0–0.2)

## 2023-11-24 LAB — RESP PANEL BY RT-PCR (RSV, FLU A&B, COVID)  RVPGX2
Influenza A by PCR: NEGATIVE
Influenza B by PCR: NEGATIVE
Resp Syncytial Virus by PCR: NEGATIVE
SARS Coronavirus 2 by RT PCR: NEGATIVE

## 2023-11-24 LAB — COMPREHENSIVE METABOLIC PANEL WITH GFR
ALT: 9 U/L (ref 0–44)
AST: 16 U/L (ref 15–41)
Albumin: 3 g/dL — ABNORMAL LOW (ref 3.5–5.0)
Alkaline Phosphatase: 133 U/L — ABNORMAL HIGH (ref 38–126)
Anion gap: 12 (ref 5–15)
BUN: 7 mg/dL (ref 6–20)
CO2: 20 mmol/L — ABNORMAL LOW (ref 22–32)
Calcium: 8.7 mg/dL — ABNORMAL LOW (ref 8.9–10.3)
Chloride: 104 mmol/L (ref 98–111)
Creatinine, Ser: 0.52 mg/dL (ref 0.44–1.00)
GFR, Estimated: >60 mL/min (ref >60)
Glucose, Bld: 99 mg/dL (ref 70–99)
Potassium: 3.3 mmol/L — ABNORMAL LOW (ref 3.5–5.1)
Sodium: 136 mmol/L (ref 135–145)
Total Bilirubin: 1.2 mg/dL (ref 0.0–1.2)
Total Protein: 6.4 g/dL — ABNORMAL LOW (ref 6.5–8.1)

## 2023-11-24 MED ORDER — LACTATED RINGERS IV SOLN: INTRAVENOUS | Status: DC

## 2023-11-24 MED ORDER — SODIUM CHLORIDE 0.9 % IV SOLN
25.0000 mg | Freq: Once | INTRAVENOUS | Status: AC
  Administered 2023-11-24: 25 mg via INTRAVENOUS
  Filled 2023-11-24: qty 25

## 2023-11-24 MED ORDER — LACTATED RINGERS IV BOLUS
1000.0000 mL | Freq: Once | INTRAVENOUS | Status: AC
Start: 2023-11-24 — End: 2023-11-24
  Administered 2023-11-24: 1000 mL via INTRAVENOUS

## 2023-11-24 MED ORDER — FAMOTIDINE IN NACL 20-0.9 MG/50ML-% IV SOLN
20.0000 mg | Freq: Two times a day (BID) | INTRAVENOUS | Status: DC
  Administered 2023-11-24: 20 mg via INTRAVENOUS
  Filled 2023-11-24: qty 50

## 2023-11-24 MED ORDER — ONDANSETRON 4 MG PO TBDP: 4.0000 mg | ORAL_TABLET | Freq: Three times a day (TID) | ORAL | 1 refills | Status: DC | PRN

## 2023-11-24 NOTE — Discharge Summary (Signed)
 Patient ID: Alyssa Rojas MRN: [id number] DOB/AGE: 2000-12-06 23 y.o.  Admit date: 11/24/2023 Discharge date: 11/24/2023  Admission Diagnoses: 23yo G4P1 at [redacted]w[redacted]d presents with N/V. She states she hasn't been able to keep anything down for the last 3 days, and felt light headed like she was going to pass out. She has not taken any medications for her nausea and vomiting. She reports she last smoked marijuana yesterday morning. Denies vaginal bleeding and LOF. States good fetal movement. Denies contractions.   Discharge Diagnoses: Improved nausea with no vomiting while here in triage.  Labs stable  Factors complicating pregnancy: Patient Active Problem List   Diagnosis Date Noted   Nausea & vomiting 11/24/2023   Nausea and vomiting during pregnancy 10/05/2023   Cramping affecting pregnancy, antepartum 09/24/2023   Vaginal discharge during pregnancy 09/24/2023   Renal abnormality of fetus on prenatal ultrasound 09/08/2023   Supervision of other normal pregnancy, antepartum 06/08/2023   Rh negative state in antepartum period 06/05/2020   Depression 04/07/2016   Migraine with aura and without status migrainosus, not intractable 03/28/2014   GERD (gastroesophageal reflux disease) 12/22/2012     Prenatal Procedures: NST  Consults: None  Significant Diagnostic Studies:  Results for orders placed or performed during the hospital encounter of 11/24/23 (from the past week)  Resp panel by RT-PCR (RSV, Flu A&B, Covid) Anterior Nasal Swab   Collection Time: 11/24/23  7:00 PM   Specimen: Anterior Nasal Swab  Result Value Ref Range   SARS Coronavirus 2 by RT PCR NEGATIVE NEGATIVE   Influenza A by PCR NEGATIVE NEGATIVE   Influenza B by PCR NEGATIVE NEGATIVE   Resp Syncytial Virus by PCR NEGATIVE NEGATIVE  CBC   Collection Time: 11/24/23  7:00 PM  Result Value Ref Range   WBC 12.7 (H) 4.0 - 10.5 K/uL   RBC 4.43 3.87 - 5.11 MIL/uL   Hemoglobin 10.8 (L) 12.0 - 15.0 g/dL   HCT 66.2 (L) 63.9 -  46.0 %   MCV 76.1 (L) 80.0 - 100.0 fL   MCH 24.4 (L) 26.0 - 34.0 pg   MCHC 32.0 30.0 - 36.0 g/dL   RDW 85.3 88.4 - 84.4 %   Platelets 185 150 - 400 K/uL   nRBC 0.0 0.0 - 0.2 %  Comprehensive metabolic panel   Collection Time: 11/24/23  7:00 PM  Result Value Ref Range   Sodium 136 135 - 145 mmol/L   Potassium 3.3 (L) 3.5 - 5.1 mmol/L   Chloride 104 98 - 111 mmol/L   CO2 20 (L) 22 - 32 mmol/L   Glucose, Bld 99 70 - 99 mg/dL   BUN 7 6 - 20 mg/dL   Creatinine, Ser 9.47 0.44 - 1.00 mg/dL   Calcium  8.7 (L) 8.9 - 10.3 mg/dL   Total Protein 6.4 (L) 6.5 - 8.1 g/dL   Albumin 3.0 (L) 3.5 - 5.0 g/dL   AST 16 15 - 41 U/L   ALT 9 0 - 44 U/L   Alkaline Phosphatase 133 (H) 38 - 126 U/L   Total Bilirubin 1.2 0.0 - 1.2 mg/dL   GFR, Estimated >39 >39 mL/min   Anion gap 12 5 - 15    Treatments: IV hydration and Pepcid  and Phenergan    Hospital Course:  This is a 23 y.o. [id number] with IUP at [redacted]w[redacted]d seen for N/V.  She was given Pepcid  and Phenergan  with satisfactory resolution of her nausea.   She was observed, fetal heart rate monitoring remained reassuring, and she had  no signs/symptoms of labor or other maternal-fetal concerns.  She was deemed stable for discharge to home with outpatient follow up.  Discharge Physical Exam:  BP 120/63 (BP Location: Right Arm)   Pulse (!) 102   Temp (!) 97.2 F (36.2 C) (Axillary)   Resp 15   Ht 5' 1 (1.549 m)   Wt 54.4 kg   LMP 04/06/2023 (Exact Date)   BMI 22.67 kg/m   NST: FHR baseline: 145 bpm Variability: moderate Accelerations: yes Decelerations: none Category/reactivity: reactive   TOCO: quiet SVE: deferred      Discharge Condition: Stable  Disposition:  Discharge disposition: 01-Home or Self Care        Allergies as of 11/24/2023   No Known Allergies      Medication List     STOP taking these medications    promethazine  25 MG suppository Commonly known as: PHENERGAN        TAKE these medications    acetaminophen   500 MG tablet Commonly known as: TYLENOL  Take 2 tablets (1,000 mg total) by mouth every 6 (six) hours as needed (for pain scale < 4  OR  temperature  >/=  100.5 F).   ondansetron  4 MG disintegrating tablet Commonly known as: ZOFRAN -ODT Take 1 tablet (4 mg total) by mouth every 8 (eight) hours as needed for nausea or vomiting.   Prenatal Vitamins 28-0.8 MG Tabs Take 1 tablet by mouth daily.        Follow-up Information     [facility] Follow up.   Why: Keep all scheduled appointments Contact information: [address] [place]  [address] [phone]                Signed:  DELON COE, CNM 11/24/2023 10:05 PM

## 2023-11-24 NOTE — OB Triage Note (Signed)
 Pt reports to labor and delivery with complaints of nausea and vomiting. She hasn't been able to keep anything down for the last 3 days. She has felt light headed like she was going to pass out.   Has not taken any medications for her nausea and vomiting. States that she last smoked marijuana yesterday morning.   Denies vaginal bleeding and LOF. States positive fetal movement. Denies contractions.   Denies intercourse in the last 24-48 hours.   EFM and toco applied and assessing.

## 2023-11-25 DIAGNOSIS — Z6791 Unspecified blood type, Rh negative: Secondary | ICD-10-CM | POA: Diagnosis not present

## 2023-11-25 DIAGNOSIS — O365930 Maternal care for other known or suspected poor fetal growth, third trimester, not applicable or unspecified: Secondary | ICD-10-CM | POA: Diagnosis not present

## 2023-11-25 DIAGNOSIS — Z3480 Encounter for supervision of other normal pregnancy, unspecified trimester: Secondary | ICD-10-CM | POA: Diagnosis not present

## 2023-11-25 DIAGNOSIS — O26899 Other specified pregnancy related conditions, unspecified trimester: Secondary | ICD-10-CM | POA: Diagnosis not present

## 2023-12-02 DIAGNOSIS — O0993 Supervision of high risk pregnancy, unspecified, third trimester: Secondary | ICD-10-CM | POA: Diagnosis not present

## 2023-12-02 LAB — OB RESULTS CONSOLE RPR: RPR: NONREACTIVE

## 2023-12-02 LAB — OB RESULTS CONSOLE HIV ANTIBODY (ROUTINE TESTING): HIV: NONREACTIVE

## 2023-12-07 DIAGNOSIS — R7309 Other abnormal glucose: Secondary | ICD-10-CM | POA: Diagnosis not present

## 2023-12-11 DIAGNOSIS — Z3A35 35 weeks gestation of pregnancy: Secondary | ICD-10-CM | POA: Diagnosis not present

## 2023-12-11 DIAGNOSIS — Z23 Encounter for immunization: Secondary | ICD-10-CM | POA: Diagnosis not present

## 2023-12-22 ENCOUNTER — Observation Stay
Admission: EM | Admit: 2023-12-22 | Discharge: 2023-12-22 | Disposition: A | Discharge status: Home / Self Care | Admitting: Obstetrics and Gynecology

## 2023-12-22 ENCOUNTER — Encounter: Payer: Self-pay | Admitting: Obstetrics and Gynecology

## 2023-12-22 ENCOUNTER — Other Ambulatory Visit: Payer: Self-pay

## 2023-12-22 DIAGNOSIS — Z7982 Long term (current) use of aspirin: Secondary | ICD-10-CM | POA: Insufficient documentation

## 2023-12-22 DIAGNOSIS — O479 False labor, unspecified: Principal | ICD-10-CM | POA: Diagnosis present

## 2023-12-22 DIAGNOSIS — O26892 Other specified pregnancy related conditions, second trimester: Principal | ICD-10-CM

## 2023-12-22 DIAGNOSIS — Z3A36 36 weeks gestation of pregnancy: Secondary | ICD-10-CM | POA: Insufficient documentation

## 2023-12-22 LAB — URINALYSIS, COMPLETE (UACMP) WITH MICROSCOPIC
Bilirubin Urine: NEGATIVE
Glucose, UA: NEGATIVE mg/dL
Hgb urine dipstick: NEGATIVE
Ketones, ur: 5 mg/dL — AB
Nitrite: NEGATIVE
Protein, ur: 30 mg/dL — AB
Specific Gravity, Urine: 1.026 (ref 1.005–1.030)
pH: 6 (ref 5.0–8.0)

## 2023-12-22 LAB — WET PREP, GENITAL
Clue Cells Wet Prep HPF POC: NONE SEEN
Sperm: NONE SEEN
Trich, Wet Prep: NONE SEEN
WBC, Wet Prep HPF POC: >=10 — AB (ref <10)
Yeast Wet Prep HPF POC: NONE SEEN

## 2023-12-22 LAB — CHLAMYDIA/NGC RT PCR (ARMC ONLY)
Chlamydia Tr: NOT DETECTED
N gonorrhoeae: NOT DETECTED

## 2023-12-22 MED ORDER — ACETAMINOPHEN 500 MG PO TABS: 1000.0000 mg | ORAL_TABLET | Freq: Four times a day (QID) | ORAL | Status: DC | PRN

## 2023-12-22 MED ORDER — CALCIUM CARBONATE ANTACID 500 MG PO CHEW: 2.0000 | CHEWABLE_TABLET | ORAL | Status: DC | PRN

## 2023-12-22 NOTE — Discharge Instructions (Signed)
 PRETERM LABOR: Includes any of the following symptoms that occur between 20-[redacted] weeks gestation. If these symptoms are not stopped, preterm labor can result in preterm delivery, placing your baby at risk.  Notify your doctor if any of the following occur: 1. Menstrual-like cramps   5. Pelvic pressure  2. Uterine contractions. These may be painless and feel like the uterus is tightening or the baby is balling up 6. Increase or change in vaginal discharge  3. Low, dull backache, unrelieved by heat or Tylenol   7. Vaginal bleeding  4. Intestinal cramps, with our without diarrhea, sometimes 8. A general feeling that something is not right   9. Leaking of fluid described as gas pain

## 2023-12-22 NOTE — OB Triage Note (Signed)
 Pt reports to labor and delivery with complaints of contractions that started around 8am this morning. They were about every 5 minutes when she was on her way to the hospital. Denies vaginal bleeding and LOF.   EFM and toco applied and assessing.

## 2023-12-22 NOTE — OB Triage Note (Signed)

## 2023-12-22 NOTE — Discharge Summary (Signed)
 Alyssa Rojas is a 23 y.o. female. She is at [redacted]w[redacted]d gestation. Patient's last menstrual period was 04/06/2023 (exact date). 01/18/2024, by Ultrasound   Prenatal care site: {Prenatal care sites:24209::[facility]}  Chief complaint: ***  Admission Diagnoses:  1) intrauterine pregnancy at [redacted]w[redacted]d  2) Uterine contractions during pregnancy [O47.9] 3) ***  Discharge Diagnoses:  Principal Problem:   Uterine contractions during pregnancy  ***  HPI: Alyssa Rojas presents to L&D with complaints of ***.  Her pregnancy is {Complicated/Uncomplicated Pregnancy:20185}.  She denies {Pregnancy Complaints:304960156:o:Contractions,Loss of fluid,Vaginal bleeding}. Endorses fetal movement as {Fetal Movement:20184::active}.   S: Resting comfortably. no CTX, no VB.no LOF,  Active fetal movement. ***  Maternal Medical History:  Past Medical Hx:  has a past medical history of Anxiety, Headache(784.0), and Migraines.    Past Surgical Hx:  has a past surgical history that includes Tonsillectomy and adenoidectomy (2012) and Esophagogastroduodenoscopy (egd) with propofol  (N/A, 01/21/2022).   No Known Allergies   Prior to Admission medications   Medication Sig Start Date End Date Taking? Authorizing Provider  aspirin 81 MG chewable tablet Chew 81 mg by mouth daily.   Yes [provider]  acetaminophen  (TYLENOL ) 500 MG tablet Take 2 tablets (1,000 mg total) by mouth every 6 (six) hours as needed (for pain scale < 4  OR  temperature  >/=  100.5 F). 10/05/23   Aisha Heller, CNM  ondansetron  (ZOFRAN -ODT) 4 MG disintegrating tablet Take 1 tablet (4 mg total) by mouth every 8 (eight) hours as needed for nausea or vomiting. 11/24/23   Myron Nest, CNM  Prenatal Vit-Fe Fumarate-FA (PRENATAL VITAMINS) 28-0.8 MG TABS Take 1 tablet by mouth daily. 04/14/20   Angelena Smalls, MD  cetirizine (ZYRTEC) 10 MG tablet Take 10 mg by mouth. Patient not taking: No sig reported 11/21/13 04/14/20  [provider]  Levonorgestrel-Ethinyl Estradiol (AMETHIA,CAMRESE) 0.15-0.03 &0.01 MG tablet Take 1 tablet by mouth at bedtime. Patient not taking: No sig reported 06/25/16 04/14/20  Janit Alm Agent, MD  nortriptyline (PAMELOR) 10 MG capsule 1 cap po qhs x7d, then increase to 2 caps po qhs Patient not taking: No sig reported 12/26/15 04/14/20  [provider]  omeprazole  (PRILOSEC ) 20 MG capsule Take 20 mg by mouth 2 (two) times daily.  04/14/20  [provider]  ranitidine (ZANTAC) 300 MG tablet Take 1 Tablet (300 MG Total) by Mouth ONE (1) TIME daily. 02/13/16 04/14/20  [provider]  rizatriptan (MAXALT) 10 MG tablet Take 10 mg by mouth. Patient not taking: No sig reported 04/07/16 04/14/20  [provider]    Social History: She  reports that she quit smoking about 2 months ago. Her smoking use included e-cigarettes. She has never used smokeless tobacco. She reports that she does not currently use alcohol. She reports current drug use. Frequency: 3.00 times per week. Drug: Marijuana.  Family History: family history includes Healthy in her mother; Hypertension in her maternal grandmother and paternal grandfather; Migraines in her paternal grandfather; Stroke in her father and paternal grandfather.   Review of Systems: A full review of systems was performed and negative except as noted in the HPI.     Pertinent Results:   O:  BP 120/73 (BP Location: Left Arm)   Pulse 70   Temp 97.9 F (36.6 C) (Oral)   Resp 16   Ht 5' 1 (1.549 m)   Wt 56.7 kg   LMP 04/06/2023 (Exact Date)   BMI 23.62 kg/m  Results for orders placed or  performed during the hospital encounter of 12/22/23 (from the past 48 hours)  Urinalysis, Complete w Microscopic -Urine, Clean Catch   Collection Time: 12/22/23 10:45 AM  Result Value Ref Range   Color, Urine YELLOW (A) YELLOW   APPearance HAZY (A) CLEAR   Specific Gravity, Urine 1.026 1.005 - 1.030   pH 6.0 5.0 - 8.0   Glucose,  UA NEGATIVE NEGATIVE mg/dL   Hgb urine dipstick NEGATIVE NEGATIVE   Bilirubin Urine NEGATIVE NEGATIVE   Ketones, ur 5 (A) NEGATIVE mg/dL   Protein, ur 30 (A) NEGATIVE mg/dL   Nitrite NEGATIVE NEGATIVE   Leukocytes,Ua MODERATE (A) NEGATIVE   RBC / HPF 0-5 0 - 5 RBC/hpf   WBC, UA 6-10 0 - 5 WBC/hpf   Bacteria, UA RARE (A) NONE SEEN   Squamous Epithelial / HPF 0-5 0 - 5 /HPF   Mucus PRESENT   Wet prep, genital   Collection Time: 12/22/23 10:54 AM   Specimen: Urine, Clean Catch  Result Value Ref Range   Yeast Wet Prep HPF POC NONE SEEN NONE SEEN   Trich, Wet Prep NONE SEEN NONE SEEN   Clue Cells Wet Prep HPF POC NONE SEEN NONE SEEN   WBC, Wet Prep HPF POC >=10 (A) <10   Sperm NONE SEEN     No results found.  Constitutional: NAD, AAOx3  PULM: nl respiratory effort Abd: gravid, non-tender Ext: Non-tender, Nonedmeatous Psych: mood appropriate, speech normal Pelvic : {OB pelvic findings:24210::deferred} SVE: Dilation: Fingertip Effacement (%): 20 Cervical Position: Posterior Station: -3 Presentation: Vertex Exam by:: ONEIDA Lukes, RN   NST: Baseline FHR: *** beats/min Variability: {fhr variability:21617} Accelerations: {DESC; PRESENT/ABSENT:17923} Decelerations: {DESC; PRESENT/ABSENT:17923} Tocometry: *** Time: at least 20 minutes   Interpretation: Category {Roman numeral i-vi:30201::I} INDICATIONS: {obgyn nst indications:312972} RESULTS:  A NST procedure was performed with FHR monitoring and a normal baseline established, appropriate time of 20-40 minutes of evaluation, and accels >2 seen w 15x15 characteristics.  Results show a REACTIVE NST.   Consults: {consultation:18241}  Procedures: ***  Hospital Course: The patient was admitted to Labor and Delivery Triage for observation. ***  She was deemed stable for discharge and further outpatient management.   Discharge Condition: {condition:18240}  Disposition: Discharge disposition: 01-Home or Self  Care       Allergies as of 12/22/2023   No Known Allergies      Medication List     TAKE these medications    acetaminophen  500 MG tablet Commonly known as: TYLENOL  Take 2 tablets (1,000 mg total) by mouth every 6 (six) hours as needed (for pain scale < 4  OR  temperature  >/=  100.5 F).   aspirin 81 MG chewable tablet Chew 81 mg by mouth daily.   ondansetron  4 MG disintegrating tablet Commonly known as: ZOFRAN -ODT Take 1 tablet (4 mg total) by mouth every 8 (eight) hours as needed for nausea or vomiting.   Prenatal Vitamins 28-0.8 MG Tabs Take 1 tablet by mouth daily.        Follow-up Information     [facility] Schedule an appointment as soon as possible for a visit.   Why: routine prenatal appointment as scheduled this week Contact information: [address] [place] [address] [phone]                ----- Therisa CHRISTELLA Pillow, CNM  Certified Nurse Midwife [facility]

## 2023-12-28 DIAGNOSIS — O365930 Maternal care for other known or suspected poor fetal growth, third trimester, not applicable or unspecified: Secondary | ICD-10-CM | POA: Diagnosis not present

## 2023-12-28 DIAGNOSIS — Z3483 Encounter for supervision of other normal pregnancy, third trimester: Secondary | ICD-10-CM | POA: Diagnosis not present

## 2023-12-28 DIAGNOSIS — O26849 Uterine size-date discrepancy, unspecified trimester: Secondary | ICD-10-CM | POA: Diagnosis not present

## 2023-12-28 LAB — OB RESULTS CONSOLE GC/CHLAMYDIA
Chlamydia: NEGATIVE
Neisseria Gonorrhea: NEGATIVE

## 2023-12-28 LAB — OB RESULTS CONSOLE GBS: GBS: NEGATIVE

## 2024-01-04 ENCOUNTER — Inpatient Hospital Stay: Admitting: Anesthesiology

## 2024-01-04 ENCOUNTER — Other Ambulatory Visit: Payer: Self-pay

## 2024-01-04 ENCOUNTER — Inpatient Hospital Stay
Admission: EM | Admit: 2024-01-04 | Discharge: 2024-01-06 | DRG: 806 | Disposition: A | Attending: Obstetrics and Gynecology | Admitting: Obstetrics and Gynecology

## 2024-01-04 ENCOUNTER — Encounter: Payer: Self-pay | Admitting: Obstetrics and Gynecology

## 2024-01-04 DIAGNOSIS — O26893 Other specified pregnancy related conditions, third trimester: Secondary | ICD-10-CM | POA: Diagnosis not present

## 2024-01-04 DIAGNOSIS — O26843 Uterine size-date discrepancy, third trimester: Secondary | ICD-10-CM | POA: Diagnosis not present

## 2024-01-04 DIAGNOSIS — Z3A38 38 weeks gestation of pregnancy: Secondary | ICD-10-CM | POA: Diagnosis not present

## 2024-01-04 DIAGNOSIS — Z87891 Personal history of nicotine dependence: Secondary | ICD-10-CM | POA: Diagnosis not present

## 2024-01-04 DIAGNOSIS — O479 False labor, unspecified: Principal | ICD-10-CM | POA: Diagnosis present

## 2024-01-04 DIAGNOSIS — K219 Gastro-esophageal reflux disease without esophagitis: Secondary | ICD-10-CM | POA: Diagnosis present

## 2024-01-04 DIAGNOSIS — O9962 Diseases of the digestive system complicating childbirth: Secondary | ICD-10-CM | POA: Diagnosis not present

## 2024-01-04 DIAGNOSIS — O9081 Anemia of the puerperium: Secondary | ICD-10-CM | POA: Diagnosis not present

## 2024-01-04 DIAGNOSIS — Z8249 Family history of ischemic heart disease and other diseases of the circulatory system: Secondary | ICD-10-CM | POA: Diagnosis not present

## 2024-01-04 DIAGNOSIS — D62 Acute posthemorrhagic anemia: Secondary | ICD-10-CM | POA: Diagnosis not present

## 2024-01-04 LAB — SYPHILIS: RPR W/REFLEX TO RPR TITER AND TREPONEMAL ANTIBODIES, TRADITIONAL SCREENING AND DIAGNOSIS ALGORITHM: RPR Ser Ql: NONREACTIVE

## 2024-01-04 LAB — CBC
HCT: 30.7 % — ABNORMAL LOW (ref 36.0–46.0)
HCT: 29.9 % — ABNORMAL LOW (ref 36.0–46.0)
Hemoglobin: 9.4 g/dL — ABNORMAL LOW (ref 12.0–15.0)
Hemoglobin: 9.1 g/dL — ABNORMAL LOW (ref 12.0–15.0)
MCH: 22.8 pg — ABNORMAL LOW (ref 26.0–34.0)
MCH: 22.5 pg — ABNORMAL LOW (ref 26.0–34.0)
MCHC: 30.6 g/dL (ref 30.0–36.0)
MCHC: 30.4 g/dL (ref 30.0–36.0)
MCV: 74.3 fL — ABNORMAL LOW (ref 80.0–100.0)
MCV: 73.8 fL — ABNORMAL LOW (ref 80.0–100.0)
Platelets: 156 K/uL (ref 150–400)
Platelets: 163 K/uL (ref 150–400)
RBC: 4.13 MIL/uL (ref 3.87–5.11)
RBC: 4.05 MIL/uL (ref 3.87–5.11)
RDW: 15.8 % — ABNORMAL HIGH (ref 11.5–15.5)
RDW: 15.9 % — ABNORMAL HIGH (ref 11.5–15.5)
WBC: 10.3 K/uL (ref 4.0–10.5)
WBC: 10.5 K/uL (ref 4.0–10.5)
nRBC: 0 % (ref 0.0–0.2)
nRBC: 0 % (ref 0.0–0.2)

## 2024-01-04 LAB — URINE DRUG SCREEN
Amphetamines: NEGATIVE
Barbiturates: NEGATIVE
Benzodiazepines: NEGATIVE
Cocaine: NEGATIVE
Fentanyl: NEGATIVE
Methadone Scn, Ur: NEGATIVE
Opiates: NEGATIVE
Tetrahydrocannabinol: POSITIVE — AB

## 2024-01-04 LAB — TYPE AND SCREEN
ABO/RH(D): A NEG
Antibody Screen: POSITIVE

## 2024-01-04 MED ORDER — ONDANSETRON HCL 4 MG/2ML IJ SOLN: 4.0000 mg | Freq: Four times a day (QID) | INTRAMUSCULAR | Status: DC | PRN

## 2024-01-04 MED ORDER — ACETAMINOPHEN 325 MG PO TABS: 650.0000 mg | ORAL_TABLET | ORAL | Status: DC | PRN

## 2024-01-04 MED ORDER — DIPHENHYDRAMINE HCL 25 MG PO CAPS: 25.0000 mg | ORAL_CAPSULE | Freq: Four times a day (QID) | ORAL | Status: DC | PRN

## 2024-01-04 MED ORDER — IBUPROFEN 600 MG PO TABS
600.0000 mg | ORAL_TABLET | Freq: Four times a day (QID) | ORAL | Status: DC
  Administered 2024-01-04 – 2024-01-06 (×6): 600 mg via ORAL
  Filled 2024-01-04 (×6): qty 1

## 2024-01-04 MED ORDER — FENTANYL-BUPIVACAINE-NACL 0.5-0.125-0.9 MG/250ML-% EP SOLN
12.0000 mL/h | EPIDURAL | Status: DC | PRN
  Administered 2024-01-04: 06:00:00 12 mL/h via EPIDURAL
  Filled 2024-01-04: qty 250

## 2024-01-04 MED ORDER — PHENYLEPHRINE 80 MCG/ML (10ML) SYRINGE FOR IV PUSH (FOR BLOOD PRESSURE SUPPORT): 80.0000 ug | PREFILLED_SYRINGE | INTRAVENOUS | Status: DC | PRN

## 2024-01-04 MED ORDER — OXYCODONE-ACETAMINOPHEN 5-325 MG PO TABS: 2.0000 | ORAL_TABLET | ORAL | Status: DC | PRN

## 2024-01-04 MED ORDER — OXYCODONE-ACETAMINOPHEN 5-325 MG PO TABS: 1.0000 | ORAL_TABLET | ORAL | Status: DC | PRN

## 2024-01-04 MED ORDER — LACTATED RINGERS IV SOLN: INTRAVENOUS | Status: AC

## 2024-01-04 MED ORDER — LIDOCAINE HCL (PF) 1 % IJ SOLN: 30.0000 mL | INTRAMUSCULAR | Status: DC | PRN

## 2024-01-04 MED ORDER — LACTATED RINGERS IV SOLN
500.0000 mL | Freq: Once | INTRAVENOUS | Status: AC
  Administered 2024-01-04: 05:00:00 500 mL via INTRAVENOUS

## 2024-01-04 MED ORDER — OXYTOCIN-SODIUM CHLORIDE 30-0.9 UT/500ML-% IV SOLN
2.5000 [IU]/h | INTRAVENOUS | Status: DC
  Administered 2024-01-04: 11:00:00 2.5 [IU]/h via INTRAVENOUS

## 2024-01-04 MED ORDER — BUPIVACAINE HCL (PF) 0.25 % IJ SOLN
INTRAMUSCULAR | Status: DC | PRN
  Administered 2024-01-04 (×2): 5 mL via EPIDURAL

## 2024-01-04 MED ORDER — MEASLES, MUMPS & RUBELLA VAC ~~LOC~~ SUSR
0.5000 mL | Freq: Once | SUBCUTANEOUS | Status: AC
  Administered 2024-01-06: 13:00:00 0.5 mL via SUBCUTANEOUS
  Filled 2024-01-04: qty 0.5

## 2024-01-04 MED ORDER — ONDANSETRON HCL 4 MG/2ML IJ SOLN: 4.0000 mg | INTRAMUSCULAR | Status: DC | PRN

## 2024-01-04 MED ORDER — FERROUS SULFATE 325 (65 FE) MG PO TABS
325.0000 mg | ORAL_TABLET | Freq: Two times a day (BID) | ORAL | Status: DC
  Administered 2024-01-04 – 2024-01-05 (×3): 325 mg via ORAL
  Filled 2024-01-04 (×3): qty 1

## 2024-01-04 MED ORDER — OXYTOCIN-SODIUM CHLORIDE 30-0.9 UT/500ML-% IV SOLN
INTRAVENOUS | Status: AC
  Filled 2024-01-04: qty 500

## 2024-01-04 MED ORDER — LIDOCAINE HCL (PF) 1 % IJ SOLN
INTRAMUSCULAR | Status: AC
  Filled 2024-01-04: qty 30

## 2024-01-04 MED ORDER — SIMETHICONE 80 MG PO CHEW: 80.0000 mg | CHEWABLE_TABLET | ORAL | Status: DC | PRN

## 2024-01-04 MED ORDER — LACTATED RINGERS IV SOLN: 500.0000 mL | INTRAVENOUS | Status: AC | PRN

## 2024-01-04 MED ORDER — PRENATAL MULTIVITAMIN CH
1.0000 | ORAL_TABLET | Freq: Every day | ORAL | Status: DC
  Administered 2024-01-05 – 2024-01-06 (×2): 1 via ORAL
  Filled 2024-01-04 (×2): qty 1

## 2024-01-04 MED ORDER — BENZOCAINE-MENTHOL 20-0.5 % EX AERO
1.0000 | INHALATION_SPRAY | CUTANEOUS | Status: DC | PRN
  Filled 2024-01-04: qty 56

## 2024-01-04 MED ORDER — MEASLES, MUMPS & RUBELLA VAC ~~LOC~~ SUSR
0.5000 mL | Freq: Once | SUBCUTANEOUS | Status: DC
  Filled 2024-01-04: qty 0.5

## 2024-01-04 MED ORDER — ZOLPIDEM TARTRATE 5 MG PO TABS: 5.0000 mg | ORAL_TABLET | Freq: Every evening | ORAL | Status: DC | PRN

## 2024-01-04 MED ORDER — LIDOCAINE HCL (PF) 1 % IJ SOLN
INTRAMUSCULAR | Status: DC | PRN
  Administered 2024-01-04: 05:00:00 3 mL

## 2024-01-04 MED ORDER — AMMONIA AROMATIC IN INHA
RESPIRATORY_TRACT | Status: AC
  Filled 2024-01-04: qty 10

## 2024-01-04 MED ORDER — MAGNESIUM HYDROXIDE 400 MG/5ML PO SUSP: 30.0000 mL | ORAL | Status: DC | PRN

## 2024-01-04 MED ORDER — DIPHENHYDRAMINE HCL 50 MG/ML IJ SOLN: 12.5000 mg | INTRAMUSCULAR | Status: DC | PRN

## 2024-01-04 MED ORDER — EPHEDRINE 5 MG/ML INJ: 10.0000 mg | INTRAVENOUS | Status: DC | PRN

## 2024-01-04 MED ORDER — COCONUT OIL OIL
1.0000 | TOPICAL_OIL | Status: DC | PRN
  Filled 2024-01-04: qty 7.5

## 2024-01-04 MED ORDER — DIBUCAINE (PERIANAL) 1 % EX OINT: 1.0000 | TOPICAL_OINTMENT | CUTANEOUS | Status: DC | PRN

## 2024-01-04 MED ORDER — FENTANYL CITRATE (PF) 100 MCG/2ML IJ SOLN
INTRAMUSCULAR | Status: AC
  Administered 2024-01-04: 05:00:00 50 ug via INTRAVENOUS
  Filled 2024-01-04: qty 2

## 2024-01-04 MED ORDER — SOD CITRATE-CITRIC ACID 500-334 MG/5ML PO SOLN: 30.0000 mL | ORAL | Status: DC | PRN

## 2024-01-04 MED ORDER — ONDANSETRON HCL 4 MG PO TABS
4.0000 mg | ORAL_TABLET | ORAL | Status: DC | PRN
  Administered 2024-01-04: 20:00:00 4 mg via ORAL
  Filled 2024-01-04: qty 1

## 2024-01-04 MED ORDER — OXYTOCIN 10 UNIT/ML IJ SOLN
INTRAMUSCULAR | Status: AC
  Filled 2024-01-04: qty 2

## 2024-01-04 MED ORDER — SENNOSIDES-DOCUSATE SODIUM 8.6-50 MG PO TABS
2.0000 | ORAL_TABLET | ORAL | Status: DC
  Administered 2024-01-04: 16:00:00 2 via ORAL
  Filled 2024-01-04 (×2): qty 2

## 2024-01-04 MED ORDER — WITCH HAZEL-GLYCERIN EX PADS: 1.0000 | MEDICATED_PAD | CUTANEOUS | Status: DC | PRN

## 2024-01-04 MED ORDER — MISOPROSTOL 200 MCG PO TABS
ORAL_TABLET | ORAL | Status: AC
  Filled 2024-01-04: qty 4

## 2024-01-04 MED ORDER — FENTANYL CITRATE (PF) 100 MCG/2ML IJ SOLN: 50.0000 ug | INTRAMUSCULAR | Status: DC | PRN

## 2024-01-04 MED ORDER — LIDOCAINE-EPINEPHRINE (PF) 1.5 %-1:200000 IJ SOLN
INTRAMUSCULAR | Status: DC | PRN
  Administered 2024-01-04: 05:00:00 3 mL via PERINEURAL

## 2024-01-04 MED ORDER — OXYTOCIN BOLUS FROM INFUSION
333.0000 mL | Freq: Once | INTRAVENOUS | Status: AC
  Administered 2024-01-04: 10:00:00 333 mL via INTRAVENOUS

## 2024-01-04 NOTE — Discharge Summary (Shared)
 Obstetrical Discharge Summary  Patient Name: Alyssa Rojas DOB: March 23, 2000 MRN: [id number]  Date of Admission: 01/04/2024 Date of Delivery: 01/04/24 Delivered by: ONEIDA Dinsmore MD Date of Discharge: 01/05/24  Primary OB: [facility]  OFE:Ejupzwu'd last menstrual period was 04/06/2023 (exact date). EDC Estimated Date of Delivery: 01/18/24 Gestational Age at Delivery: [redacted]w[redacted]d   Antepartum complications: below Admitting Diagnosis:  Secondary Diagnosis: Patient Active Problem List   Diagnosis Date Noted   Uterine contractions during pregnancy 12/22/2023   Nausea & vomiting 11/24/2023   Nausea and vomiting during pregnancy 10/05/2023   Cramping affecting pregnancy, antepartum 09/24/2023   Vaginal discharge during pregnancy 09/24/2023   Renal abnormality of fetus on prenatal ultrasound 09/08/2023   Supervision of other normal pregnancy, antepartum 06/08/2023   Rh negative state in antepartum period 06/05/2020   Depression 04/07/2016   Migraine with aura and without status migrainosus, not intractable 03/28/2014   GERD (gastroesophageal reflux disease) 12/22/2012    Augmentation: N/A Complications: None Intrapartum complications/course:  Date of Delivery:  Delivered By: IVAR Dinsmore MD Delivery Type: spontaneous vaginal delivery Anesthesia: epidural Placenta: spontaneous Laceration: none Episiotomy: none Newborn Data: Live born child  Birth Weight:  female apgars 8/9   Newborn Delivery   Birth date/time: 01/04/2024 09:43:59 Delivery type: Vaginal, Spontaneous     Postpartum Procedures: IV Venofer   Edinburgh:     01/05/2024    7:30 AM 12/11/2020    3:30 PM 12/11/2020   11:00 AM 12/10/2020   10:00 PM 04/26/2020   10:17 AM  Edinburgh Postnatal Depression Scale Screening Tool  I have been able to laugh and see the funny side of things. -- 0  --  --  --   I have looked forward with enjoyment to things.  0      I have blamed myself unnecessarily when  things went wrong.  0      I have been anxious or worried for no good reason.  1      I have felt scared or panicky for no good reason.  0      Things have been getting on top of me.  0      I have been so unhappy that I have had difficulty sleeping.  0      I have felt sad or miserable.  0      I have been so unhappy that I have been crying.  0      The thought of harming myself has occurred to me.  0      Edinburgh Postnatal Depression Scale Total  1         Data saved with a previous flowsheet row definition    Post partum course:   Patient had an uncomplicated postpartum course.  By time of discharge on PPD#1, her pain was controlled on oral pain medications; she had appropriate lochia and was ambulating, voiding without difficulty and tolerating regular diet.  She was deemed stable for discharge to home.    Discharge Physical Exam:   BP 110/82 (BP Location: Left Arm)   Pulse 62   Temp 98.1 F (36.7 C) (Oral)   Resp 20   Ht 5' 1 (1.549 m)   Wt 54.5 kg   LMP 04/06/2023 (Exact Date)   SpO2 100%   BMI 22.70 kg/m   General: NAD CV: RRR Pulm: CTABL, nl effort ABD: s/nd/nt, fundus firm and below the umbilicus Lochia: moderate Incision: c/d/i DVT Evaluation: LE non-ttp, no evidence of DVT on  exam.  Hemoglobin  Date Value Ref Range Status  01/05/2024 8.7 (L) 12.0 - 15.0 g/dL Final    Comment:    Reticulocyte Hemoglobin testing may be clinically indicated, consider ordering this additional test [id number]   06/05/2020 10.8 (L) 11.1 - 15.9 g/dL Final   HCT  Date Value Ref Range Status  01/05/2024 28.8 (L) 36.0 - 46.0 % Final   Hematocrit  Date Value Ref Range Status  06/05/2020 32.3 (L) 34.0 - 46.6 % Final     Disposition: stable, discharge to home. Baby Feeding: breastmilk  Baby Disposition: home with mom  Rh Immune globulin  given: administered 01/05/24 Rubella vaccine given: offered Tdap vaccine given in AP or PP setting: given AP Flu vaccine given in AP or  PP setting: declined  Contraception: undecided  Prenatal Labs:   ABO, Rh:  A neg Antibody:  neg Rubella: Nonimmune (06/09 0000)  RPR: Nonreactive (11/12 0000)  HBsAg: Negative (06/09 0000) , hep C non reactive  HIV: Non-reactive (11/12 0000)  GBS: Negative/-- (12/08 0000)     Plan:  Lott FORBES Novak was discharged to home in good condition. Follow-up appointment with delivering provider in 6 weeks.  Discharge Medications: Allergies as of 01/05/2024   No Known Allergies      Medication List     STOP taking these medications    aspirin 81 MG chewable tablet   ondansetron  4 MG disintegrating tablet Commonly known as: ZOFRAN -ODT       TAKE these medications    acetaminophen  325 MG tablet Commonly known as: TYLENOL  Take 2 tablets (650 mg total) by mouth every 6 (six) hours as needed for mild pain (pain score 1-3) or fever (for pain scale < 4  OR  temperature  >/=  100.5 F). What changed:  medication strength how much to take reasons to take this   benzocaine -Menthol  20-0.5 % Aero Commonly known as: DERMOPLAST Apply 1 Application topically as needed for irritation (perineal discomfort).   ferrous sulfate  325 (65 FE) MG tablet Take 1 tablet (325 mg total) by mouth 2 (two) times daily with a meal.   ibuprofen  600 MG tablet Commonly known as: ADVIL  Take 1 tablet (600 mg total) by mouth every 6 (six) hours as needed for fever, mild pain (pain score 1-3) or cramping.   Prenatal Vitamins 28-0.8 MG Tabs Take 1 tablet by mouth daily.   senna-docusate 8.6-50 MG tablet Commonly known as: Senokot-S Take 2 tablets by mouth at bedtime as needed for mild constipation.   simethicone  80 MG chewable tablet Commonly known as: MYLICON Chew 1 tablet (80 mg total) by mouth as needed for flatulence.   witch hazel-glycerin  pad Commonly known as: TUCKS Apply 1 Application topically as needed for hemorrhoids.         Follow-up Information     Miriam Liles, Debby PARAS, MD  Follow up in 6 week(s).   Specialty: Obstetrics and Gynecology Why: 6wk postpartum Contact information: [address] [place] [address] [phone]                 Signed:  Edsel Charlies Blush, CNM 01/05/2024 11:30 AM

## 2024-01-04 NOTE — H&P (Signed)
 Alyssa Rojas is a 23 y.o. female presenting for uterine ctx . EGA [redacted] weeks . GBS neg  Rubella Non Immune . Recent u/s AGA  OB History     Gravida  4   Para  1   Term  1   Preterm  0   AB  2   Living  1      SAB  2   IAB  0   Ectopic  0   Multiple      Live Births  1          Past Medical History:  Diagnosis Date   Anxiety    Headache(784.0)    Migraines    Past Surgical History:  Procedure Laterality Date   ESOPHAGOGASTRODUODENOSCOPY (EGD) WITH PROPOFOL  N/A 01/21/2022   Procedure: ESOPHAGOGASTRODUODENOSCOPY (EGD) WITH PROPOFOL ;  Surgeon: Maryruth Ole DASEN, MD;  Location: [facility] ENDOSCOPY;  Service: Endoscopy;  Laterality: N/A;   TONSILLECTOMY AND ADENOIDECTOMY  2012   Done at [facility]   Family History: family history includes Healthy in her mother; Hypertension in her maternal grandmother and paternal grandfather; Migraines in her paternal grandfather; Stroke in her father and paternal grandfather. Social History:  reports that she quit smoking about 2 months ago. Her smoking use included e-cigarettes. She has never used smokeless tobacco. She reports that she does not currently use alcohol. She reports current drug use. Frequency: 3.00 times per week. Drug: Marijuana.     Maternal Diabetes: No Genetic Screening: Normal Maternal Ultrasounds/Referrals: Normal Fetal Ultrasounds or other Referrals:  None Maternal Substance Abuse:  No Significant Maternal Medications:  None Significant Maternal Lab Results:  Group B Strep negative Number of Prenatal Visits:greater than 3 verified prenatal visits Maternal Vaccinations:RSV: Given during pregnancy >/=14 days ago, TDap, and Flu Other Comments:  None  Review of Systems History Dilation: 6 Effacement (%): 100 Station: Plus 2 Exam by:: Sireen Halk Temperature 98.7 F (37.1 C), temperature source Oral, resp. rate 14, last menstrual period 04/06/2023, unknown if currently breastfeeding. Exam Physical Exam   Lungs  CTA   CV RRR  Cx 6cm / c/ +2 VTX   Cat 1 fetal monitoring  Prenatal labs: ABO, Rh:  A neg Antibody:  neg Rubella: Nonimmune (06/09 0000) RPR: Nonreactive (11/12 0000)  HBsAg: Negative (06/09 0000)  HIV: Non-reactive (11/12 0000)  GBS: Negative/-- (12/08 0000)   Assessment/Plan: Active labor  Reassuring fetal monitoring  Anticipate SVD  Pt requests CLE   Alyssa Rojas 01/04/2024, 4:46 AM

## 2024-01-04 NOTE — Lactation Note (Signed)
 This note was copied from a baby's chart. Lactation Consultation Note  Patient Name: Boy Coni Homesley Unijb'd Date: 01/04/2024 Age:22 hours Reason for consult: Follow-up assessment;Breastfeeding assistance   Maternal Data Has patient been taught Hand Expression?: Yes Does the patient have breastfeeding experience prior to this delivery?: Yes How long did the patient breastfeed?: 1 week  Lactation to room to follow up with a feeding at the breast.  Mom stated that infant hasn't eaten in a while is hungry.  Feeding Mother's Current Feeding Choice: Breast Milk  LC encouraged dad to remove clothing and swaddle to wake infant for a feeding.  Mom independently latched infant to the rt breast in cradle hold.  Mom has great positioning w/ infant at the breast.  Several attempts were made to get infant to feed.    Infant initially doesn't open mouth and is very fussy at the breast.  After several attempts infant opened mouth and suckled only for 5 minutes.  Audible swallows were heard during this time.  Mom did endorse minor pain.  LC provided some tips and tricks to get infant to open up latch wider to prevent pain.  LATCH Score Latch: Repeated attempts needed to sustain latch, nipple held in mouth throughout feeding, stimulation needed to elicit sucking reflex.  Audible Swallowing: Spontaneous and intermittent  Type of Nipple: Everted at rest and after stimulation  Comfort (Breast/Nipple): Soft / non-tender  Hold (Positioning): No assistance needed to correctly position infant at breast.  LATCH Score: 9  Interventions Interventions: Breast feeding basics reviewed;Assisted with latch;Breast compression;Adjust position;Education  LC reviewed the importance of feeding infant on demand at least 8x w/in a 24hr period.  Mom verbalized understanding.    Discharge Pump: Personal WIC Program: Yes  Consult Status Consult Status: Follow-up Date: 01/04/24 Follow-up type:  In-patient    Olamae Ferrara S Leron Stoffers 01/04/2024, 5:18 PM

## 2024-01-04 NOTE — Progress Notes (Signed)
 Patient ID: Alyssa Rojas, female   DOB: 04-17-2000, 23 y.o.   MRN: [id number] Resting post CLE . Cx recheck still 8 cm . Regular ctx . Cat1 fetal monitoring . Anticipate SVD

## 2024-01-04 NOTE — Lactation Note (Signed)
 This note was copied from a baby's chart. Lactation Consultation Note  Patient Name: Alyssa Rojas Unijb'd Date: 01/04/2024 Age:22 hours Reason for consult: Initial assessment   Maternal Data Does the patient have breastfeeding experience prior to this delivery?: Yes How long did the patient breastfeed?: 1 week Initial Assessment w/ 4 hour old baby Alyssa Montgomery , birthing parent and grandmother. This was a SVD at [redacted]w[redacted]d. MOB stated that the first latch went well and that baby breastfed from both breasts without any pain. MOB expressed that the last feeding attempt was painful pinchy. Baby would open wide to latch onto the left breast but would pull away to then end of the nipple once at the breast. Pt stated that baby cried for the last three hours and that this was first time he had been quiet and resting since birth.   Mom will have a used portable Medela pump, but would like to look into renting or receiving one from [facility] and/or Medicaid.  Feeding Mother's Current Feeding Choice: Breast Milk  Interventions  LC students provided education on the following;  milk production expectations, hunger cues, day 1/2 wet/dirty diapers, cluster feeding, benefits of STS and arousing infant for a feeding.  Lactation students informed patient of feeding infant at least 8 or more times w/in a 24hr period but not exceeding 3hrs. Patient verbalized understanding.    Discharge Pump: Personal WIC Program: Yes   Consult Status Consult Status: Follow-up Date: 01/04/24 Follow-up type: In-patient    [facility] & Donita Birmingham 01/04/2024, 3:31 PM

## 2024-01-04 NOTE — Anesthesia Procedure Notes (Signed)
 Epidural Patient location during procedure: OB Start time: 01/04/2024 5:23 AM End time: 01/04/2024 5:24 AM  Staffing Anesthesiologist: Chesley Lendia CROME, MD Performed: anesthesiologist   Preanesthetic Checklist Completed: patient identified, IV checked, site marked, risks and benefits discussed, surgical consent, monitors and equipment checked, pre-op evaluation and timeout performed  Epidural Patient position: sitting Prep: ChloraPrep Patient monitoring: heart rate, continuous pulse ox and blood pressure Approach: midline Location: L3-L4 Injection technique: LOR saline  Needle:  Needle insertion depth (cm): 5.5 Needle type: Tuohy  Needle gauge: 17 G Needle length: 9 cm and 9 Needle insertion depth: 5.5 cm Catheter type: closed end flexible Catheter size: 19 Gauge Catheter at skin depth: 11 cm Test dose: negative and 1.5% lidocaine  with Epi 1:200 K  Assessment Sensory level: T10 Events: blood not aspirated, injection not painful, no injection resistance, no paresthesia and negative IV test  Additional Notes 1 attempt Pt. Evaluated and documentation done after procedure finished. Patient identified. Risks/Benefits/Options discussed with patient including but not limited to bleeding, infection, nerve damage, paralysis, failed block, incomplete pain control, headache, blood pressure changes, nausea, vomiting, reactions to medication both or allergic, itching and postpartum back pain. Confirmed with bedside nurse the patient's most recent platelet count. Confirmed with patient that they are not currently taking any anticoagulation, have any bleeding history or any family history of bleeding disorders. Patient expressed understanding and wished to proceed. All questions were answered. Sterile technique was used throughout the entire procedure. Please see nursing notes for vital signs. Test dose was given through epidural catheter and negative prior to continuing to dose epidural or  start infusion. Warning signs of high block given to the patient including shortness of breath, tingling/numbness in hands, complete motor block, or any concerning symptoms with instructions to call for help. Patient was given instructions on fall risk and not to get out of bed. All questions and concerns addressed with instructions to call with any issues or inadequate analgesia.    Patient tolerated the insertion well without immediate complications. Reason for block:procedure for pain

## 2024-01-04 NOTE — Anesthesia Preprocedure Evaluation (Signed)
 Anesthesia Evaluation  Patient identified by MRN, date of birth, ID band Patient awake    Reviewed: Allergy & Precautions, NPO status , Patient's Chart, lab work & pertinent test results  History of Anesthesia Complications Negative for: history of anesthetic complications  Airway Mallampati: II  TM Distance: >3 FB Neck ROM: full    Dental no notable dental hx.    Pulmonary neg pulmonary ROS, Patient abstained from smoking., former smoker   Pulmonary exam normal        Cardiovascular Exercise Tolerance: Good negative cardio ROS Normal cardiovascular exam     Neuro/Psych  Headaches PSYCHIATRIC DISORDERS Anxiety Depression       GI/Hepatic ,GERD  Medicated,,  Endo/Other    Renal/GU   negative genitourinary   Musculoskeletal   Abdominal   Peds  Hematology negative hematology ROS (+)   Anesthesia Other Findings Past Medical History: No date: Anxiety No date: Headache(784.0) No date: Migraines  Past Surgical History: 01/21/2022: ESOPHAGOGASTRODUODENOSCOPY (EGD) WITH PROPOFOL ; N/A     Comment:  Procedure: ESOPHAGOGASTRODUODENOSCOPY (EGD) WITH               PROPOFOL ;  Surgeon: Maryruth Ole DASEN, MD;  Location:               [facility] ENDOSCOPY;  Service: Endoscopy;  Laterality: N/A; 2012: TONSILLECTOMY AND ADENOIDECTOMY     Comment:  Done at [facility]  BMI    Body Mass Index: 22.70 kg/m      Reproductive/Obstetrics (+) Pregnancy                              Anesthesia Physical Anesthesia Plan  ASA: 2  Anesthesia Plan: Epidural   Post-op Pain Management:    Induction:   PONV Risk Score and Plan:   Airway Management Planned: Natural Airway  Additional Equipment:   Intra-op Plan:   Post-operative Plan:   Informed Consent: I have reviewed the patients History and Physical, chart, labs and discussed the procedure including the risks, benefits and alternatives for the proposed  anesthesia with the patient or authorized representative who has indicated his/her understanding and acceptance.     Dental Advisory Given  Plan Discussed with: Anesthesiologist  Anesthesia Plan Comments: (Patient reports no bleeding problems and no anticoagulant use.   Patient consented for risks of anesthesia including but not limited to:  - adverse reactions to medications - risk of bleeding, infection and or nerve damage from epidural that could lead to paralysis - risk of headache or failed epidural - nerve damage due to positioning - that if epidural is used for C-section that there is a chance of epidural failure requiring spinal placement or conversion to GA - Damage to heart, brain, lungs, other parts of body or loss of life  Patient voiced understanding and assent.)        Anesthesia Quick Evaluation

## 2024-01-05 DIAGNOSIS — O9081 Anemia of the puerperium: Secondary | ICD-10-CM | POA: Diagnosis not present

## 2024-01-05 DIAGNOSIS — Z2839 Other underimmunization status: Secondary | ICD-10-CM | POA: Diagnosis not present

## 2024-01-05 LAB — CBC
HCT: 28.8 % — ABNORMAL LOW (ref 36.0–46.0)
Hemoglobin: 8.7 g/dL — ABNORMAL LOW (ref 12.0–15.0)
MCH: 22.4 pg — ABNORMAL LOW (ref 26.0–34.0)
MCHC: 30.2 g/dL (ref 30.0–36.0)
MCV: 74 fL — ABNORMAL LOW (ref 80.0–100.0)
Platelets: 149 K/uL — ABNORMAL LOW (ref 150–400)
RBC: 3.89 MIL/uL (ref 3.87–5.11)
RDW: 16.1 % — ABNORMAL HIGH (ref 11.5–15.5)
WBC: 11.8 K/uL — ABNORMAL HIGH (ref 4.0–10.5)
nRBC: 0 % (ref 0.0–0.2)

## 2024-01-05 LAB — FETAL SCREEN: Fetal Screen: NEGATIVE

## 2024-01-05 MED ORDER — ACETAMINOPHEN 325 MG PO TABS: 650.0000 mg | ORAL_TABLET | Freq: Four times a day (QID) | ORAL | 0 refills | Status: AC | PRN

## 2024-01-05 MED ORDER — IRON SUCROSE 300 MG IVPB - SIMPLE MED
300.0000 mg | Freq: Once | Status: AC
  Administered 2024-01-05: 13:00:00 300 mg via INTRAVENOUS
  Filled 2024-01-05: qty 300

## 2024-01-05 MED ORDER — METHYLPREDNISOLONE SODIUM SUCC 125 MG IJ SOLR
60.0000 mg | Freq: Once | INTRAMUSCULAR | Status: AC
  Administered 2024-01-05: 19:00:00 60 mg via INTRAVENOUS
  Filled 2024-01-05: qty 0.96

## 2024-01-05 MED ORDER — WITCH HAZEL-GLYCERIN EX PADS: 1.0000 | MEDICATED_PAD | CUTANEOUS | 0 refills | Status: AC | PRN

## 2024-01-05 MED ORDER — DIPHENHYDRAMINE HCL 25 MG PO CAPS
50.0000 mg | ORAL_CAPSULE | Freq: Once | ORAL | Status: AC
  Administered 2024-01-05: 15:00:00 50 mg via ORAL

## 2024-01-05 MED ORDER — DIPHENHYDRAMINE HCL 25 MG PO CAPS
25.0000 mg | ORAL_CAPSULE | Freq: Three times a day (TID) | ORAL | Status: DC
  Administered 2024-01-05 – 2024-01-06 (×2): 25 mg via ORAL
  Filled 2024-01-05 (×2): qty 1

## 2024-01-05 MED ORDER — SIMETHICONE 80 MG PO CHEW: 80.0000 mg | CHEWABLE_TABLET | ORAL | 0 refills | Status: AC | PRN

## 2024-01-05 MED ORDER — IBUPROFEN 600 MG PO TABS: 600.0000 mg | ORAL_TABLET | Freq: Four times a day (QID) | ORAL | 0 refills | Status: AC | PRN

## 2024-01-05 MED ORDER — METHYLPREDNISOLONE SODIUM SUCC 125 MG IJ SOLR
60.0000 mg | Freq: Once | INTRAMUSCULAR | Status: AC
  Administered 2024-01-05: 15:00:00 60 mg via INTRAVENOUS
  Filled 2024-01-05: qty 0.96

## 2024-01-05 MED ORDER — BENZOCAINE-MENTHOL 20-0.5 % EX AERO: 1.0000 | INHALATION_SPRAY | CUTANEOUS | 0 refills | Status: AC | PRN

## 2024-01-05 MED ORDER — FERROUS SULFATE 325 (65 FE) MG PO TABS: 325.0000 mg | ORAL_TABLET | Freq: Two times a day (BID) | ORAL | 3 refills | Status: AC

## 2024-01-05 MED ORDER — METHYLPREDNISOLONE SODIUM SUCC 125 MG IJ SOLR: 60.0000 mg | Freq: Once | INTRAMUSCULAR | Status: DC

## 2024-01-05 MED ORDER — RHO D IMMUNE GLOBULIN 1500 UNIT/2ML IJ SOSY
300.0000 ug | PREFILLED_SYRINGE | Freq: Once | INTRAMUSCULAR | Status: AC
  Administered 2024-01-05: 12:00:00 300 ug via INTRAVENOUS
  Filled 2024-01-05: qty 2

## 2024-01-05 MED ORDER — SENNOSIDES-DOCUSATE SODIUM 8.6-50 MG PO TABS: 2.0000 | ORAL_TABLET | Freq: Every evening | ORAL | 0 refills | Status: AC | PRN

## 2024-01-05 NOTE — Progress Notes (Signed)
 Generalized edema. Rash improving. PIV re-inserted. Solumedrol and Benadryl  given as ordered. VSS before, after, and since reaction occurred 15 min after stop of IV iron .

## 2024-01-05 NOTE — Progress Notes (Signed)
 Patient ID: Alyssa Rojas, female   DOB: 05-Feb-2000, 23 y.o.   MRN: [id number] Allergic reaction to Venofer  today . NO SOB , just swelling . Itching . Benadryl   and now 2 doses of solumedrol given . Pt feels fine  VSS P: add po benadryl  25 mg q 8 hrs  until swelling resolves

## 2024-01-05 NOTE — Clinical Social Work Maternal (Addendum)
 CLINICAL SOCIAL WORK MATERNAL/CHILD NOTE  Patient Details  Name: Alyssa Rojas MRN: [id number] Date of Birth: 09-Oct-2000  Date:  01/05/2024  Clinical Social Worker Initiating Note:  Alyssa Rojas Date/Time: Initiated:  01/05/24/      Child's Name:  Alyssa Rojas   Biological Parents:  Mother, Father   Need for Interpreter:  None   Reason for Referral:  Current Substance Use/Substance Use During Pregnancy     Address:  [address] [place] [address]    Phone number:  [phone] (home)     Additional phone number:   Household Members/Support Persons (HM/SP):   Household Member/Support Person 1   HM/SP Name Relationship DOB or Age  HM/SP -1 Alyssa Rojas FOB    HM/SP -2        HM/SP -3        HM/SP -4        HM/SP -5        HM/SP -6        HM/SP -7        HM/SP -8          Natural Supports (not living in the home):  Extended Family, Friends, Immediate Family, Parent, Spouse/significant other   Professional Supports:     Employment:   Patient stated she is technically employed but not working.  Type of Work:     Education:      Homebound arranged:    Architect:      Other Resources:  [facility]   Cultural/Religious Considerations Which May Impact Care:    Strengths:  Ability to meet basic needs  , Home prepared for child  , Pediatrician chosen   Psychotropic Medications:       None  Pediatrician:    [employer]  Pediatrician List:   [facility]    [facility]  [facility]      Pediatrician Fax Number: [phone]  Risk Factors/Current Problems:  Substance Use     Cognitive State:  Alert     Mood/Affect:  Calm  , Happy     CSW Assessment: CSW met with patient. FOB Alyssa Rojas) at bedside. CSW introduced role and explained that discharge planning would be discussed. Baby's name is Alyssa. Patient is feeling good this morning. Address on  facesheet is correct. Patient lives with FOB. She reports other supports as her mother, FOB's sister, her own sister, and other family members and friends. Patient confirmed having WIC and has plans to get Alyssa Rojas on it as well. Patient reports history of anxiety. She is not currently taking medications for this or receiving counseling. CSW provided resources if needed. No SI/HI. Gave information for PPD. Pediatric[facility] will be at [facility] (Dr. Dagmar). Alyssa does not have an appointment yet. She is aware of SIDS risks. Patient confirmed she has all needed supplies at home including somewhere for baby to sleep and car seat. FOB will transport them home at discharge. Patient is aware that she and Alyssa tested positive for THC and that, per hospital policy, it is required that we make a CPS report. CSW sent email to CPS social workers requested a call when they are able so that report can be made. Patient declined substance use resources. No further concerns.  11:27 am: CPS report made to Webb at [facility].  11:41 am: Patient has orders to discharge home today. No further concerns.  CSW signing off.  CSW Plan/Description:  Child Protective Service Report      Alyssa JAYSON Carpen, LCSW 01/05/2024, 9:38 AM

## 2024-01-05 NOTE — Anesthesia Postprocedure Evaluation (Signed)
 Anesthesia Post Note  Patient: Alyssa Rojas  Procedure(s) Performed: AN AD HOC LABOR EPIDURAL  Patient location during evaluation: Mother Baby Anesthesia Type: Epidural Level of consciousness: awake Pain management: satisfactory to patient Vital Signs Assessment: post-procedure vital signs reviewed and stable Respiratory status: spontaneous breathing Cardiovascular status: stable Postop Assessment: patient able to bend at knees, no apparent nausea or vomiting, adequate PO intake and able to ambulate Anesthetic complications: no Comments: Has been able to void.   No notable events documented.   Last Vitals:  Vitals:   01/04/24 2119 01/04/24 2321  BP: 120/81 133/85  Pulse: (!) 53 (!) 57  Resp:  18  Temp:  36.8 C  SpO2:  100%    Last Pain:  Vitals:   01/04/24 2321  TempSrc: Oral  PainSc:                  Isel Skufca

## 2024-01-05 NOTE — Discharge Summary (Signed)
 Obstetrical Discharge Summary  Patient Name: Alyssa Rojas DOB: 12-25-2000 MRN: [id number]  Date of Admission: 01/04/2024 Date of Delivery: 01/04/24 Delivered by: ONEIDA Dinsmore MD Date of Discharge:  01/06/2024   Primary OB: [facility]  OFE:Ejupzwu'd last menstrual period was 04/06/2023 (exact date). EDC Estimated Date of Delivery: 01/18/24 Gestational Age at Delivery: [redacted]w[redacted]d   Antepartum complications: below Admitting Diagnosis:  Secondary Diagnosis: Patient Active Problem List   Diagnosis Date Noted   NSVD (normal spontaneous vaginal delivery) 01/06/2024   Uterine contractions during pregnancy 12/22/2023   Nausea & vomiting 11/24/2023   Nausea and vomiting during pregnancy 10/05/2023   Cramping affecting pregnancy, antepartum 09/24/2023   Vaginal discharge during pregnancy 09/24/2023   Renal abnormality of fetus on prenatal ultrasound 09/08/2023   Supervision of other normal pregnancy, antepartum 06/08/2023   Rh negative state in antepartum period 06/05/2020   Depression 04/07/2016   Migraine with aura and without status migrainosus, not intractable 03/28/2014   GERD (gastroesophageal reflux disease) 12/22/2012    Augmentation: N/A Complications: None Intrapartum complications/course:  Date of Delivery:  Delivered By: IVAR Dinsmore MD Delivery Type: spontaneous vaginal delivery Anesthesia: epidural Placenta: spontaneous Laceration: none Episiotomy: none Newborn Data: Live born child  Birth Weight:  female apgars 8/9   Newborn Delivery   Birth date/time: 01/04/2024 09:43:59 Delivery type: Vaginal, Spontaneous     Postpartum Procedures: IV Venofer   Edinburgh:     01/05/2024   12:33 PM 01/05/2024    7:30 AM 12/11/2020    3:30 PM 12/11/2020   11:00 AM 12/10/2020   10:00 PM  Edinburgh Postnatal Depression Scale Screening Tool  I have been able to laugh and see the funny side of things. 0 -- 0  --  --   I have looked forward with enjoyment to  things. 0  0     I have blamed myself unnecessarily when things went wrong. 1  0     I have been anxious or worried for no good reason. 2  1     I have felt scared or panicky for no good reason. 1  0     Things have been getting on top of me. 1  0     I have been so unhappy that I have had difficulty sleeping. 0  0     I have felt sad or miserable. 1  0     I have been so unhappy that I have been crying. 1  0     The thought of harming myself has occurred to me. 0  0     Edinburgh Postnatal Depression Scale Total 7  1        Data saved with a previous flowsheet row definition    Post partum course:   Patient had an uncomplicated postpartum course.  By time of discharge on PPD#1, her pain was controlled on oral pain medications; she had appropriate lochia and was ambulating, voiding without difficulty and tolerating regular diet.  She was deemed stable for discharge to home.    Discharge Physical Exam:   BP 112/81 (BP Location: Left Arm)   Pulse 76   Temp 98.5 F (36.9 C) (Oral)   Resp 18   Ht 5' 1 (1.549 m)   Wt 54.5 kg   LMP 04/06/2023 (Exact Date)   SpO2 99%   BMI 22.70 kg/m   General: NAD CV: RRR Pulm: CTABL, nl effort ABD: s/nd/nt, fundus firm and below the umbilicus Lochia: moderate Incision: c/d/i  DVT Evaluation: LE non-ttp, no evidence of DVT on exam.  Hemoglobin  Date Value Ref Range Status  01/05/2024 8.7 (L) 12.0 - 15.0 g/dL Final    Comment:    Reticulocyte Hemoglobin testing may be clinically indicated, consider ordering this additional test [id number]   06/05/2020 10.8 (L) 11.1 - 15.9 g/dL Final   HCT  Date Value Ref Range Status  01/05/2024 28.8 (L) 36.0 - 46.0 % Final   Hematocrit  Date Value Ref Range Status  06/05/2020 32.3 (L) 34.0 - 46.6 % Final     Disposition: stable, discharge to home. Baby Feeding: breastmilk  Baby Disposition: home with mom  Rh Immune globulin  given: administered 01/05/24 Rubella vaccine given: offered Tdap  vaccine given in AP or PP setting: given AP Flu vaccine given in AP or PP setting: declined  Contraception: undecided  Prenatal Labs:   ABO, Rh:  A neg Antibody:  neg Rubella: Nonimmune (06/09 0000)  RPR: Nonreactive (11/12 0000)  HBsAg: Negative (06/09 0000) , hep C non reactive  HIV: Non-reactive (11/12 0000)  GBS: Negative/-- (12/08 0000)     Plan:  Lott [place]ak was discharged to home in good condition. Follow-up appointment with delivering provider in 6 weeks.  Discharge Medications: Allergies as of 01/06/2024       Reactions   Iron  Sucrose Swelling, Rash        Medication List     STOP taking these medications    aspirin 81 MG chewable tablet   ondansetron  4 MG disintegrating tablet Commonly known as: ZOFRAN -ODT       TAKE these medications    acetaminophen  325 MG tablet Commonly known as: TYLENOL  Take 2 tablets (650 mg total) by mouth every 6 (six) hours as needed for mild pain (pain score 1-3) or fever (for pain scale < 4  OR  temperature  >/=  100.5 F). What changed:  medication strength how much to take reasons to take this   benzocaine -Menthol  20-0.5 % Aero Commonly known as: DERMOPLAST Apply 1 Application topically as needed for irritation (perineal discomfort).   ferrous sulfate  325 (65 FE) MG tablet Take 1 tablet (325 mg total) by mouth 2 (two) times daily with a meal.   ibuprofen  600 MG tablet Commonly known as: ADVIL  Take 1 tablet (600 mg total) by mouth every 6 (six) hours as needed for fever, mild pain (pain score 1-3) or cramping.   Prenatal Vitamins 28-0.8 MG Tabs Take 1 tablet by mouth daily.   senna-docusate 8.6-50 MG tablet Commonly known as: Senokot-S Take 2 tablets by mouth at bedtime as needed for mild constipation.   simethicone  80 MG chewable tablet Commonly known as: MYLICON Chew 1 tablet (80 mg total) by mouth as needed for flatulence.   witch hazel-glycerin  pad Commonly known as: TUCKS Apply 1 Application  topically as needed for hemorrhoids.         Follow-up Information     Schermerhorn, Debby PARAS, MD Follow up in 6 week(s).   Specialty: Obstetrics and Gynecology Why: 6wk postpartum Contact information: [address] [place] [address] [phone]                 Signed:  Margery [place] Myron EDDY 01/06/2024 12:56 PM

## 2024-01-05 NOTE — Progress Notes (Addendum)
 Patient just called out stating her hands were swelling bilaterally and venous rash to bilateral feet/ankle area. Message left to D. Wilson.

## 2024-01-05 NOTE — Lactation Note (Signed)
 This note was copied from a baby's chart. Lactation Consultation Note  Patient Name: Alyssa Rojas Unijb'd Date: 01/05/2024 Age:22 hours Reason for consult: Follow-up assessment;Early term 37-38.6wks;Mother's request;RN request;Breastfeeding assistance   Maternal Data  This is mom's 2nd baby, SVD. Mom with history of anxiety and MJ use. Mom and baby positive for THC. Mom's blood type is A negative/ Coombs+ and baby's blood type is A positive/ Coombs+. Baby is being monitored closely for jaundice with bili checks every 8 hour which so far per care nurse today is in acceptable range.   On follow up mom reports baby has had 2 stools and 1 wet since 5:30 pm and has been latching and breastfeeding with swallows. Mom requested assistance with breastfeeding at this feeding as baby is not sustaining latch. Mom noted to not have enough support pillows and baby was not in an optimum position. LC assisted mom optimize position and baby readily latched and sustained latch. Mom, grandma, and LC noted multiple audible swallows. Mom was able to independently position and latch baby at the opposite breast. Mom reports she has not rested as she had a reaction to her iron  infusion. After feeding LC swaddled baby and assisted mom with putting baby in to the bassinet so mom could try and rest.  Has patient been taught Hand Expression?: Yes Does the patient have breastfeeding experience prior to this delivery?: Yes How long did the patient breastfeed?: Baby did not latch so mom pumped for a few weeks but got mastitis X2 and switched to formula.  Feeding Mother's Current Feeding Choice: Breast Milk  LATCH Score Latch: Grasps breast easily, tongue down, lips flanged, rhythmical sucking.  Audible Swallowing: Spontaneous and intermittent  Type of Nipple: Everted at rest and after stimulation  Comfort (Breast/Nipple): Soft / non-tender (Mom with a healing compression stripe on right nipple. Since latch corrected  mom is no longer reporting any discomfort. She is applying coconut oil after each breastfeeding to her nipples.)  Hold (Positioning): Assistance needed to correctly position infant at breast and maintain latch.  LATCH Score: 9   Interventions Interventions: Adjust position;Support pillows;Education Mom verbalized feeding plan reviewed earlier if baby does not latch and breastfeed at a feeding. (See note from earlier today 1:06 pm)  Discharge Pump: Personal  Consult Status Consult Status: Follow-up Date: 01/06/24 Follow-up type: In-patient  Update provided to care nurse.  Avelina DELENA Gaskins 01/05/2024, 9:17 PM

## 2024-01-05 NOTE — Lactation Note (Signed)
 This note was copied from a baby's chart. Lactation Consultation Note  Patient Name: Alyssa Rojas Date: 01/05/2024 Age:22 hours  Reason for consult: Follow-up assessment;Early term 37-38.6wks;RN request;Mother's request; breastfeeding assistance   Maternal Data  This is mom's 2nd baby, SVD. Mom with history of anxiety and MJ use. Mom and baby positive for THC. Mom's blood type is A negative/ Coombs+ and baby's blood type is A positive/ Coombs+. Baby is being monitored closely for jaundice with bili checks every 8 hour which so far per care nurse is in acceptable range.   On follow-up visit LC was requested to assisted mother with breastfeeding  Baby had latched and breastfed 9 times in 24 hours however throughout last night mom reports baby was sleepy and did not feed as well. Per care nurse she assisted mom with breastfeeding and pumping this am. Mom expressed 1 ml which was syringe fed to baby. Baby has had 3 voids and 3 stools in 24 hours.  Feeding  Mother's choice on admission: breastmilk and formula   Currently: breastmilk Provided tips and strategies to maximize position and latch techniques. Recommended mom remove baby's clothes and feed baby in the diaper. Recommended football hold and for mom to support her breast during the feeding. Recommended mom massage her breast to encourage baby to keep interested in feeding. Baby latched to the breast readily in football hold. Baby with lips rolled inward. Mom noted to have a compression stripe on her right nipple that has cracked. Discussed with mom difference of a deep latch and a shallow latch. Encouraged mom to correct upper and lower lip latch which she was independently able to do. Baby with multiple audible swallows noted by mom, LC, and care nurse. Care nurse also at bedside to observe baby at the breast. Baby fed well at this feed. Discussed with mom if at a feeding baby will not latch to call [facility] and/or care nurse for  breastfeeding assistance. Discussed feeding plan if baby will not latch and breastfeed or if he falls asleep despite using strategies shown to wake a sleepy baby. Mom understands and is in agreement she can offer supplemental milk and any pumped colostrum after breastfeeding attempt. Mom will use a slow flow bottle. Reviewed with mom if baby does not feed well at a feeding to post pump.  LATCH Score=9 see flow sheet. Score of 1 on hold and positioning.  Lactation Tools Discussed/Used  DEBP  Interventions  Breast feeding basics reviewed;Assisted with latch;Breast compression;Adjust position;Support pillows;Position options;Education;Coconut oil Reviewed what to expect in first days when breastfeeding: feeding cues, 8-12 feeds in 24 hours, cluster feeding, how to know the baby is getting enough, how to wake a sleepy baby, burping techniques. LC number on white board.  Discharge  Engorgement and breast care;Warning signs for feeding baby;Outpatient recommendation;  Mom has Personal use pump.  Consult Status  Follow-up 01/06/24 In-patient  Update provided to care nurse, requested care nurse to bedside to observe baby latched well with audible swallows.  Alyssa Rojas 01/05/2024, 5:40 PM

## 2024-01-05 NOTE — Progress Notes (Signed)
 RNcalled to say the pt had a reaction to an Iron  infusion about 3 hours ago, she was previously given Solumedrol and Bendaryl, the pt has swelling and appears pale her VSS and the rash that first appeared is resolving. [facility] midwife not available.  Subjective: Sitting up in bed holding her infant, denies any dizziness or SOB/difficulty breathing. Visitor in room agrees that her complexion is significantly more pale than her normal.   Objective: Blood pressure 110/75, pulse 64, temperature (!) 97.5 F (36.4 C), temperature source Oral, resp. rate 18, height 5' 1 (1.549 m), weight 54.5 kg, last menstrual period 04/06/2023, SpO2 98%, unknown if currently breastfeeding.  Physical Exam:  General: alert, cooperative, pale, and no facial swelling LUNGS: CTAB Heart RRR no skips, gallops or murmurs Extremities: hands swollen                     +1 to +2 BLE, rash not apparent    Recent Labs    01/04/24 0754 01/05/24 0049  HGB 9.1* 8.7*  HCT 29.9* 28.8*    Assessment/Plan: Post transfusion reaction   RN called pharmacy,  Second dose of Solumedrol ordered per Pharmacy recommendation    LOS: 1 day   JINNIE HERO [facility], CNM 01/05/2024, 6:49 PM

## 2024-01-05 NOTE — Progress Notes (Signed)
 Pharmacy notified patient is still swollen in BUE and BLE and pale since iron  infusion reaction. Pharmacist recommending another dose of solumedrol. On call provider Sierra Dominc CNM covering)notified and came to see patient. See new order.

## 2024-01-05 NOTE — Progress Notes (Signed)
 Post Partum Day 1 Subjective: Doing well, no complaints.  Tolerating regular diet, pain with PO meds, voiding and ambulating without difficulty.  No CP SOB Fever,Chills, N/V or leg pain; denies nipple or breast pain, no HA change of vision, RUQ/epigastric pain  Objective: BP 110/75 (BP Location: Right Arm)   Pulse 64   Temp (!) 97.5 F (36.4 C) (Oral)   Resp 18   Ht 5' 1 (1.549 m)   Wt 54.5 kg   LMP 04/06/2023 (Exact Date)   SpO2 98%   BMI Alyssa.70 kg/m    Physical Exam:  General: NAD Breasts: soft/nontender CV: RRR Pulm: nl effort, CTABL Abdomen: soft, NT, BS x 4 Perineum: minimal edema, intact Lochia: moderate Uterine Fundus: fundus firm and 1 fb below umbilicus DVT Evaluation: no cords, ttp LEs   Recent Labs    01/04/24 0754 01/05/24 0049  HGB 9.1* 8.7*  HCT 29.9* 28.8*  WBC 10.5 11.8*  PLT 163 149*    Assessment/Plan: Alyssa y.o. [id number] postpartum day # 1  - Continue routine PP care - Lactation consult PRN - Discussed contraceptive options including implant, IUDs hormonal and non-hormonal, injection, pills/ring/patch, condoms, and NFP.  - Acute blood loss anemia, clinically significant - hemoglobin changed from 9.1 to 8.7, patient is asymptomatic, hemodynamically stable; start po ferrous sulfate  BID with stool softeners, administer Venofer  IV  - Immunization status: Needs MMR prior to DC  Disposition: Does not desire Dc home today. Infant remains inpatient so she would like to stay inpatient as well.   Edsel Charlies Blush, CNM 01/05/2024 4:49 PM

## 2024-01-06 LAB — RHOGAM INJECTION: Unit division: 0

## 2024-01-06 NOTE — TOC Progression Note (Signed)
 Transition of Care [facility]) - Progression Note    Patient Details  Name: Alyssa Rojas MRN: [id number] Date of Birth: 09/17/2000  [facility]) CM/SW Contact  K'La JINNY Ruts, LCSW Phone Number: 01/06/2024, 1:04 PM  Clinical Narrative:    Chart reviewed. CPS report was screened out.                      Expected Discharge Plan and Services         Expected Discharge Date: 01/06/24                                     Social Drivers of Health (SDOH) Interventions SDOH Screenings   Food Insecurity: No Food Insecurity (01/04/2024)  Housing: Low Risk (01/04/2024)  Transportation Needs: No Transportation Needs (01/04/2024)  Utilities: Not At Risk (01/04/2024)  Financial Resource Strain: Low Risk  (06/29/2023)   Received from [facility]  Social Connections: Socially Integrated (01/04/2024)  Tobacco Use: Medium Risk (01/04/2024)    Readmission Risk Interventions     No data to display

## 2024-01-06 NOTE — Lactation Note (Signed)
 This note was copied from a baby's chart. Lactation Consultation Note  Patient Name: Alyssa Rojas Unijb'd Date: 01/06/2024 Age:22 hours Reason for consult: Follow-up assessment;Nipple pain/trauma   Maternal Data  This is mom's 2nd baby, SVD. 81 hour old [employer].  MOB and FOB both present in room. Baby was being held by mom. Recently circumcised. Mom expressed that baby would not latch last night, even after attempting to flange lips, so they opted to supplement with formula. Mom experiencing nipple pain and crack on right nipple. Unable to latch baby and she stated that using the Medela hand pump is also causing pain. Mom using hydrogel patches for soothing. Mom still wants to breastfeed.  Feeding Mother's Current Feeding Choice: Breast Milk and Formula Nipple Type: Slow - flow    Lactation Tools Discussed/Used  Sore Breast shells were given to mom to place inside of her bra to minimize friction against nipples. Nipple shield was given to mom in case she wanted to latch baby once they returned home. She did not want to attempt during visit due to baby being asleep. Verbal and written instructions given to parents on how to use, place on breast, and clean. Mom is aware that nipple shield will not reduce pain but help baby grasp breast better, if need be. Education on hydrogel patches given.  Interventions  Mom experiencing nipple pain and cracked nipples. She was given hydrogel patches previously by nursing staff. Mom presented with the options to try to feed baby with nipple shield now or to give her nipples a break. She chose to give nipples a break for now to heal and would try to latch baby once nipples healed or with the 20mm nipple shield.  Discharge Discharge Education: Engorgement and breast care We discussed the engorgement protocol, lymphatic breast massage and use of ibuprofen  and ice. CDC Breast Milk Storage guidelines and written nipple shield education given to  parents  Consult Status Consult Status: Complete Follow-up type: Out-patient    Marianne Daring 01/06/2024, 11:40 AM

## 2024-01-06 NOTE — Progress Notes (Signed)
 Pt discharged with infant.  Discharge instructions, prescriptions and follow up appointment given to and reviewed with pt. Pt verbalized understanding. Escorted out by auxillary.
